# Patient Record
Sex: Male | Born: 1965 | Race: Black or African American | Hispanic: No | Marital: Single | Smoking: Never smoker
Health system: Southern US, Community
[De-identification: ages and names within clinical notes are randomized; demographics above are authoritative.]

## PROBLEM LIST (undated history)

## (undated) DIAGNOSIS — E785 Hyperlipidemia, unspecified: Secondary | ICD-10-CM

## (undated) DIAGNOSIS — I2699 Other pulmonary embolism without acute cor pulmonale: Secondary | ICD-10-CM

## (undated) DIAGNOSIS — I82409 Acute embolism and thrombosis of unspecified deep veins of unspecified lower extremity: Secondary | ICD-10-CM

## (undated) DIAGNOSIS — Z86718 Personal history of other venous thrombosis and embolism: Secondary | ICD-10-CM

## (undated) DIAGNOSIS — F329 Major depressive disorder, single episode, unspecified: Secondary | ICD-10-CM

## (undated) DIAGNOSIS — F32A Depression, unspecified: Secondary | ICD-10-CM

## (undated) DIAGNOSIS — M5030 Other cervical disc degeneration, unspecified cervical region: Secondary | ICD-10-CM

## (undated) DIAGNOSIS — G8929 Other chronic pain: Secondary | ICD-10-CM

## (undated) DIAGNOSIS — E7800 Pure hypercholesterolemia, unspecified: Secondary | ICD-10-CM

## (undated) DIAGNOSIS — R519 Headache, unspecified: Secondary | ICD-10-CM

## (undated) DIAGNOSIS — I824Y9 Acute embolism and thrombosis of unspecified deep veins of unspecified proximal lower extremity: Secondary | ICD-10-CM

## (undated) HISTORY — PX: BACK SURGERY: SHX140

## (undated) HISTORY — DX: Other chronic pain: G89.29

## (undated) HISTORY — DX: Pure hypercholesterolemia, unspecified: E78.00

## (undated) HISTORY — DX: Personal history of other venous thrombosis and embolism: Z86.718

## (undated) HISTORY — PX: CERVICAL DISC SURGERY: SHX588

## (undated) HISTORY — DX: Headache, unspecified: R51.9

## (undated) HISTORY — DX: Depression, unspecified: F32.A

---
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
## 2012-01-21 ENCOUNTER — Emergency Department (INDEPENDENT_AMBULATORY_CARE_PROVIDER_SITE_OTHER)
Admission: EM | Admit: 2012-01-21 | Discharge: 2012-01-21 | Disposition: A | Discharge status: Home / Self Care | Payer: Medicare Other | Source: Home / Self Care | Attending: Emergency Medicine | Admitting: Emergency Medicine

## 2012-01-21 ENCOUNTER — Encounter (HOSPITAL_COMMUNITY): Payer: Self-pay

## 2012-01-21 DIAGNOSIS — J069 Acute upper respiratory infection, unspecified: Secondary | ICD-10-CM

## 2012-01-21 DIAGNOSIS — R05 Cough: Secondary | ICD-10-CM

## 2012-01-21 DIAGNOSIS — R059 Cough, unspecified: Secondary | ICD-10-CM

## 2012-01-21 HISTORY — DX: Other cervical disc degeneration, unspecified cervical region: M50.30

## 2012-01-21 MED ORDER — ACETAMINOPHEN-CODEINE #3 300-30 MG PO TABS: 1.0000 | ORAL_TABLET | Freq: Four times a day (QID) | ORAL | Status: DC | PRN

## 2012-01-21 MED ORDER — PREDNISONE 20 MG PO TABS: 40.0000 mg | ORAL_TABLET | Freq: Every day | ORAL | Status: DC

## 2012-01-21 NOTE — ED Notes (Addendum)
reportedly had flu shot 11-3, has developed cough , congestion x 3 days; NAD; C/o pain left scapular area

## 2012-01-21 NOTE — ED Provider Notes (Signed)
History     CSN: [id number]  Arrival date & time 01/21/12  1350   First MD Initiated Contact with Patient 01/21/12 1356      Chief Complaint  Patient presents with  . Cough    (Consider location/radiation/quality/duration/timing/severity/associated sxs/prior treatment) HPI Comments: Patient presents urgent care describing that for the last 3 days he's had a dry cough as been congested of his nose and a sore throat. He described it he had the flu shot this year November. Denies any shortness of breath, or wheezing or fevers within the last 3 days. Some discomfort when he coughs on his left scapular area. Denies any pain or shortness of breath or fevers. Feels somewhat congested in his chest but his not coughing up any phlegm " it's mainly a dry cough"  Patient is a 47 y.o. male presenting with cough. The history is provided by the patient.  Cough This is a new problem. The current episode started 2 days ago. The problem occurs constantly. The problem has been gradually worsening. The cough is non-productive. There has been no fever. Associated symptoms include rhinorrhea and sore throat. Pertinent negatives include no chest pain, no chills, no weight loss, no ear pain, no myalgias, no shortness of breath and no wheezing. He has tried nothing for the symptoms. The treatment provided no relief. His past medical history does not include asthma.    Past Medical History  Diagnosis Date  . Degenerative cervical disc     Past Surgical History  Procedure Date  . Cervical disc surgery     History reviewed. No pertinent family history.  History  Substance Use Topics  . Smoking status: Never Smoker   . Smokeless tobacco: Not on file  . Alcohol Use: No      Review of Systems  Constitutional: Negative for fever, chills, weight loss, activity change and fatigue.  HENT: Positive for sore throat and rhinorrhea. Negative for ear pain, neck pain and neck stiffness.   Respiratory: Positive  for cough. Negative for chest tightness, shortness of breath and wheezing.   Cardiovascular: Negative for chest pain.  Musculoskeletal: Negative for myalgias.  Skin: Negative for rash.  Neurological: Negative for dizziness.    Allergies  Review of patient's allergies indicates no known allergies.  Home Medications   Current Outpatient Rx  Name  Route  Sig  Dispense  Refill  . ACETAMINOPHEN-CODEINE #3 300-30 MG PO TABS   Oral   Take 1-2 tablets by mouth every 6 (six) hours as needed for pain.   15 tablet   0   . PREDNISONE 20 MG PO TABS   Oral   Take 2 tablets (40 mg total) by mouth daily. 2 tablets daily for 5 days   10 tablet   0     BP 127/76  Pulse 100  Temp 97.6 F (36.4 C) (Oral)  Resp 16  SpO2 100%  Physical Exam  Nursing note and vitals reviewed. Constitutional: He appears well-developed and well-nourished.  Non-toxic appearance. He does not have a sickly appearance. He does not appear ill. No distress.  HENT:  Head: Normocephalic.  Eyes: Conjunctivae normal are normal.  Neck: Neck supple. No JVD present.  Cardiovascular: Normal rate.  Exam reveals no gallop and no friction rub.   Murmur heard.      Apical pulse 88 beats per minute-during cardiovascular exam  Pulmonary/Chest: Effort normal and breath sounds normal. No respiratory distress. He has no decreased breath sounds. He has no wheezes. He has  no rhonchi. He has no rales. He exhibits no tenderness.  Skin: No rash noted. No erythema.    ED Course  Procedures (including critical care time)  Labs Reviewed - No data to display No results found.   1. Upper respiratory infection   2. Cough       MDM  Symptoms and exam consistent with a new lead develop upper respiratory infection. In no respiratory distress afebrile and with a pulse ox on room air 100%. On exam was unremarkable. Prescribe patient Tylenol No. 3, for cough and discomfort and prednisone for bronchial congestion. Patient was  instructed to followup with primary care Dr. Or  [place] was to persist beyond 2 weeks. For a second lung exam.     Jimmie Molly, MD 01/21/12 1440

## 2012-02-20 ENCOUNTER — Encounter (HOSPITAL_COMMUNITY): Payer: Self-pay

## 2012-02-20 ENCOUNTER — Emergency Department (HOSPITAL_COMMUNITY)
Admission: EM | Admit: 2012-02-20 | Discharge: 2012-02-20 | Disposition: A | Discharge status: Home / Self Care | Payer: PRIVATE HEALTH INSURANCE | Attending: Emergency Medicine | Admitting: Emergency Medicine

## 2012-02-20 DIAGNOSIS — M549 Dorsalgia, unspecified: Secondary | ICD-10-CM

## 2012-02-20 DIAGNOSIS — R209 Unspecified disturbances of skin sensation: Secondary | ICD-10-CM | POA: Insufficient documentation

## 2012-02-20 DIAGNOSIS — R238 Other skin changes: Secondary | ICD-10-CM | POA: Insufficient documentation

## 2012-02-20 DIAGNOSIS — Z8739 Personal history of other diseases of the musculoskeletal system and connective tissue: Secondary | ICD-10-CM | POA: Insufficient documentation

## 2012-02-20 DIAGNOSIS — M545 Low back pain, unspecified: Secondary | ICD-10-CM | POA: Insufficient documentation

## 2012-02-20 MED ORDER — IBUPROFEN 600 MG PO TABS: 600.0000 mg | ORAL_TABLET | Freq: Four times a day (QID) | ORAL | Status: DC | PRN

## 2012-02-20 MED ORDER — CYCLOBENZAPRINE HCL 10 MG PO TABS: 10.0000 mg | ORAL_TABLET | Freq: Two times a day (BID) | ORAL | Status: DC | PRN

## 2012-02-20 MED ORDER — HYDROCODONE-IBUPROFEN 7.5-200 MG PO TABS: 1.0000 | ORAL_TABLET | Freq: Four times a day (QID) | ORAL | Status: DC | PRN

## 2012-02-20 NOTE — ED Notes (Addendum)
Some ecchymosis noted to 2nd finger of right hand. Sharp and dull sensation intact bilaterally to lower extremities.

## 2012-02-20 NOTE — ED Notes (Signed)
Pt presents with 1 week h/o low back pain.  Pt denies any injury, reports pain radiates around into his groin and into bilateral thighs.  Pt denies any injury, denies any dysuria or discolored urine.  Pt reports last bowel movement was 3-4 days ago.  Pt also reports "discoloration" to R 2nd fingertip.  Pt denies any injury to finger or swelling, reports a "tingling sensation" to R fingers that is now relieved except to R thumb and 2nd finger.  Pt reports "soreness" across forehead.

## 2012-02-20 NOTE — ED Provider Notes (Signed)
History     CSN: [id number]  Arrival date & time 02/20/12  1126   First MD Initiated Contact with Patient 02/20/12 1358      No chief complaint on file.  ) HPI Pt presents with 1 week h/o low back pain. Pt denies any injury, reports pain radiates around into his groin and into bilateral thighs. Pt denies any injury, denies any dysuria or discolored urine. Pt reports last bowel movement was 3-4 days ago. Pt also reports "discoloration" to R 2nd fingertip. Pt denies any injury to finger or swelling, reports a "tingling sensation" to R fingers that is now relieved except to R thumb and 2nd finger  Past Medical History  Diagnosis Date  . Degenerative cervical disc     Past Surgical History  Procedure Laterality Date  . Cervical disc surgery      History reviewed. No pertinent family history.  History  Substance Use Topics  . Smoking status: Never Smoker   . Smokeless tobacco: Not on file  . Alcohol Use: No      Review of Systems All other systems reviewed and are negati Allergies  Review of patient's allergies indicates no known allergies.  Home Medications   Current Outpatient Rx  Name  Route  Sig  Dispense  Refill  . cyclobenzaprine (FLEXERIL) 10 MG tablet   Oral   Take 1 tablet (10 mg total) by mouth 2 (two) times daily as needed for muscle spasms.   20 tablet   0   . HYDROcodone-ibuprofen (VICOPROFEN) 7.5-200 MG per tablet   Oral   Take 1 tablet by mouth every 6 (six) hours as needed for pain.   30 tablet   0   . ibuprofen (ADVIL,MOTRIN) 600 MG tablet   Oral   Take 1 tablet (600 mg total) by mouth every 6 (six) hours as needed for pain.   30 tablet   0     BP 118/85  Pulse 56  Temp(Src) 98.1 F (36.7 C) (Oral)  Resp 18  SpO2 99%  Physical Exam  Nursing note and vitals reviewed. Constitutional: He is oriented to person, place, and time. He appears well-developed and well-nourished. No distress.  HENT:  Head: Normocephalic and atraumatic.    Eyes: Pupils are equal, round, and reactive to light.  Neck: Normal range of motion.  Cardiovascular: Normal rate and intact distal pulses.   Pulmonary/Chest: No respiratory distress.  Abdominal: Normal appearance. He exhibits no distension.  Musculoskeletal: He exhibits tenderness.       Back:  Pain to palpation and movement were indicated  Neurological: He is alert and oriented to person, place, and time. No cranial nerve deficit.  Skin: Skin is warm and dry. No rash noted.  Psychiatric: He has a normal mood and affect. His behavior is normal.    ED Course  Procedures (including critical care time)  Labs Reviewed - No data to display No results found.   1. Back pain       MDM          Nelia Shi, MD 02/20/12 1524

## 2012-04-22 ENCOUNTER — Encounter (HOSPITAL_COMMUNITY): Payer: Self-pay | Admitting: Emergency Medicine

## 2012-04-22 ENCOUNTER — Emergency Department (HOSPITAL_COMMUNITY)
Admission: EM | Admit: 2012-04-22 | Discharge: 2012-04-22 | Disposition: A | Discharge status: Home / Self Care | Payer: PRIVATE HEALTH INSURANCE | Source: Home / Self Care

## 2012-04-22 NOTE — ED Notes (Signed)
Pt c/o neck pain x 3 days. Pt states started new work out and shortly after started having stiffness and pain with rotating head to the left.   Pt has a hx of c4 disk replacement.   Pt has not tried anything for treatment.

## 2012-04-26 ENCOUNTER — Ambulatory Visit: Payer: Self-pay | Admitting: Medical

## 2012-05-21 ENCOUNTER — Ambulatory Visit: Payer: PRIVATE HEALTH INSURANCE | Attending: Sports Medicine | Admitting: Physical Therapy

## 2012-05-21 DIAGNOSIS — M542 Cervicalgia: Secondary | ICD-10-CM | POA: Insufficient documentation

## 2012-05-21 DIAGNOSIS — IMO0001 Reserved for inherently not codable concepts without codable children: Secondary | ICD-10-CM | POA: Insufficient documentation

## 2012-06-02 ENCOUNTER — Ambulatory Visit: Payer: PRIVATE HEALTH INSURANCE | Admitting: Physical Therapy

## 2012-06-03 ENCOUNTER — Ambulatory Visit: Payer: PRIVATE HEALTH INSURANCE | Admitting: Physical Therapy

## 2012-06-08 ENCOUNTER — Encounter: Payer: Medicare Other | Admitting: Physical Therapy

## 2012-06-10 ENCOUNTER — Encounter: Payer: Medicare Other | Admitting: Physical Therapy

## 2012-06-29 ENCOUNTER — Emergency Department (HOSPITAL_COMMUNITY)
Admission: EM | Admit: 2012-06-29 | Discharge: 2012-06-29 | Disposition: A | Discharge status: Home / Self Care | Payer: PRIVATE HEALTH INSURANCE | Attending: Emergency Medicine | Admitting: Emergency Medicine

## 2012-06-29 ENCOUNTER — Encounter (HOSPITAL_COMMUNITY): Payer: Self-pay | Admitting: *Deleted

## 2012-06-29 ENCOUNTER — Emergency Department (HOSPITAL_COMMUNITY): Payer: PRIVATE HEALTH INSURANCE

## 2012-06-29 DIAGNOSIS — Z8739 Personal history of other diseases of the musculoskeletal system and connective tissue: Secondary | ICD-10-CM | POA: Insufficient documentation

## 2012-06-29 DIAGNOSIS — R072 Precordial pain: Secondary | ICD-10-CM | POA: Insufficient documentation

## 2012-06-29 DIAGNOSIS — R079 Chest pain, unspecified: Secondary | ICD-10-CM

## 2012-06-29 DIAGNOSIS — Z8719 Personal history of other diseases of the digestive system: Secondary | ICD-10-CM | POA: Insufficient documentation

## 2012-06-29 LAB — CBC
MCHC: 34 g/dL (ref 30.0–36.0)
MCV: 76.7 fL — ABNORMAL LOW (ref 78.0–100.0)
RBC: 5.37 MIL/uL (ref 4.22–5.81)
WBC: 3.2 10*3/uL — ABNORMAL LOW (ref 4.0–10.5)

## 2012-06-29 LAB — BASIC METABOLIC PANEL
BUN: 18 mg/dL (ref 6–23)
CO2: 28 mEq/L (ref 19–32)
GFR calc non Af Amer: 73 mL/min — ABNORMAL LOW (ref >90)
Glucose, Bld: 124 mg/dL — ABNORMAL HIGH (ref 70–99)
Potassium: 3.9 mEq/L (ref 3.5–5.1)

## 2012-06-29 MED ORDER — NITROGLYCERIN 2 % TD OINT: 1.0000 [in_us] | TOPICAL_OINTMENT | Freq: Once | TRANSDERMAL | Status: DC

## 2012-06-29 NOTE — ED Provider Notes (Signed)
History    CSN: [id number] Arrival date & time 06/29/12  1843  First MD Initiated Contact with Patient 06/29/12 2043     Chief Complaint  Patient presents with  . Chest Pain   (Consider location/radiation/quality/duration/timing/severity/associated sxs/prior Treatment) Patient is a 47 y.o. male presenting with chest pain.  Chest Pain Associated symptoms: no cough, no fatigue, no fever, no nausea, no shortness of breath and not vomiting    Pt is a 47yo male presenting with midsternal chest pain that woke pt from a nap earlier this afternoon, described as sharp in nature, non-radiating, 7/10, and constant.  Nothing seemed to make pain better or worse.  Pt came to be checked out because he was concerned pain was not going away.  States pain persisted until he went to CXR, since then pain has subsided. Denies fever, SOB, pain with breathing, cough, diaphoresis, n/v/d.  Reports similar episodes in past but none that lasted this long.  Pt states he is relatively healthy.  Denies cardiopulmonary disease, known GERD, no recent travel, surgeries, or sick contacts.  Denies any daily medication use.  Denies alcohol or other drug use, including cocaine.     Past Medical History  Diagnosis Date  . Degenerative cervical disc    Past Surgical History  Procedure Laterality Date  . Cervical disc surgery     No family history on file. History  Substance Use Topics  . Smoking status: Never Smoker   . Smokeless tobacco: Not on file  . Alcohol Use: No    Review of Systems  Constitutional: Negative for fever, chills and fatigue.  Respiratory: Negative for cough and shortness of breath.   Cardiovascular: Positive for chest pain.  Gastrointestinal: Negative for nausea, vomiting and diarrhea.  All other systems reviewed and are negative.    Allergies  Other and Shellfish allergy  Home Medications   Current Outpatient Rx  Name  Route  Sig  Dispense  Refill  . cyclobenzaprine (FLEXERIL) 10  MG tablet   Oral   Take 1 tablet (10 mg total) by mouth 2 (two) times daily as needed for muscle spasms.   20 tablet   0   . HYDROcodone-ibuprofen (VICOPROFEN) 7.5-200 MG per tablet   Oral   Take 1 tablet by mouth every 6 (six) hours as needed for pain.   30 tablet   0   . ibuprofen (ADVIL,MOTRIN) 600 MG tablet   Oral   Take 1 tablet (600 mg total) by mouth every 6 (six) hours as needed for pain.   30 tablet   0    BP 102/84  Pulse 58  Temp(Src) 98.2 F (36.8 C) (Oral)  Resp 18  SpO2 100% Physical Exam  Nursing note and vitals reviewed. Constitutional: He appears well-developed and well-nourished. No distress.  Pt lying comfortably on exam bed. NAD.   HENT:  Head: Normocephalic and atraumatic.  Eyes: Conjunctivae are normal. No scleral icterus.  Neck: Normal range of motion.  Cardiovascular: Normal rate, regular rhythm and normal heart sounds.   Pulmonary/Chest: Effort normal and breath sounds normal. No respiratory distress. He has no wheezes. He has no rales. He exhibits no tenderness.  Abdominal: Soft. Bowel sounds are normal. He exhibits no distension and no mass. There is no tenderness. There is no rebound and no guarding.  Musculoskeletal: Normal range of motion.  Neurological: He is alert.  Skin: Skin is warm and dry. He is not diaphoretic.    ED Course  Procedures (including critical care  time) Labs Reviewed  CBC - Abnormal; Notable for the following:    WBC 3.2 (*)    MCV 76.7 (*)    Platelets 130 (*)    All other components within normal limits  BASIC METABOLIC PANEL - Abnormal; Notable for the following:    Glucose, Bld 124 (*)    GFR calc non Af Amer 73 (*)    GFR calc Af Amer 85 (*)    All other components within normal limits  POCT I-STAT TROPONIN I   Dg Chest 2 View  06/29/2012   *RADIOLOGY REPORT*  Clinical Data: Chest pain.  CHEST - 2 VIEW  Comparison: None.  Findings: The heart size and pulmonary vascularity are normal and the lungs are  clear.  No osseous abnormality.  IMPRESSION: Normal exam.   Original Report Authenticated By: Francene Boyers, M.D.   1. Nonspecific chest pain     MDM  Pt presenting with chest pain is a young healthy male.  Pain had resolved after pt went to CXR w/o any medications given.  Low concern for ACS.  istat troponin: unremarkable CBC: leukopenia, no previous labs to compare BMP: mildly decreased GFR but BUN and Cr WNL.  No previous labs.   CXR: nl exam  EKG: nl  F/u with PCP, [facility] info provided. Return precautions given. Pt verbalized understanding and agreement with tx plan. Vitals: unremarkable. Discharged in stable condition.    Discussed pt with attending during ED encounter.    Junius Finner, PA-C 06/29/12 2212

## 2012-06-29 NOTE — ED Notes (Signed)
Pt is here with chest pain that woke him up while laying down, pt states it continued to hurt while on Facebook.  No sob, nausea or sweating.  No pain now.

## 2012-06-29 NOTE — ED Provider Notes (Signed)
9:51 PM  Date: 06/29/2012  Rate:69  Rhythm: normal sinus rhythm  QRS Axis: normal  Intervals: normal  ST/T Wave abnormalities: normal  Conduction Disutrbances:none  Narrative Interpretation: Normal EKG  Old EKG Reviewed: none available    Carleene Cooper III, MD 06/29/12 2152

## 2012-06-29 NOTE — Discharge Instructions (Signed)
Chest Pain, Nonspecific  It is often hard to give a specific diagnosis for the cause of chest pain. There is always a chance that your pain could be related to something serious, like a heart attack or a blood clot in the lungs. You need to follow up with your caregiver for further evaluation. More lab tests or other studies such as X-rays, electrocardiography, stress testing, or cardiac imaging may be needed to find the cause of your pain.  Most of the time, nonspecific chest pain improves within 2 to 3 days with rest and mild pain medicine. For the next few days, avoid physical exertion or activities that bring on pain. Do not smoke. Avoid drinking alcohol. Call your caregiver for routine follow-up as advised.   SEEK IMMEDIATE MEDICAL CARE IF:  · You develop increased chest pain or pain that radiates to the arm, neck, jaw, back, or abdomen.  · You develop shortness of breath, increased coughing, or you start coughing up blood.  · You have severe back or abdominal pain, nausea, or vomiting.  · You develop severe weakness, fainting, fever, or chills.  Document Released: 12/23/2004 Document Revised: 03/17/2011 Document Reviewed: 06/12/2006  [employer]® Patient Information ©2014 [employer].

## 2012-06-30 NOTE — ED Provider Notes (Signed)
Medical screening examination/treatment/procedure(s) were conducted as a shared visit with non-physician practitioner(s) and myself.  I personally evaluated the patient during the encounter 47 yo man with substernal pain after waking from a nap.  PE negative. EKG, chest x-ray, TNI all negative.  Doubt ACS.  Reassured and released, referred to [facility].  Carleene Cooper III, MD 06/30/12 [phone]

## 2012-09-17 ENCOUNTER — Emergency Department (INDEPENDENT_AMBULATORY_CARE_PROVIDER_SITE_OTHER)
Admission: EM | Admit: 2012-09-17 | Discharge: 2012-09-17 | Disposition: A | Discharge status: Home / Self Care | Payer: PRIVATE HEALTH INSURANCE | Source: Home / Self Care | Attending: Family Medicine | Admitting: Family Medicine

## 2012-09-17 ENCOUNTER — Encounter (HOSPITAL_COMMUNITY): Payer: Self-pay | Admitting: Emergency Medicine

## 2012-09-17 ENCOUNTER — Emergency Department (INDEPENDENT_AMBULATORY_CARE_PROVIDER_SITE_OTHER): Payer: PRIVATE HEALTH INSURANCE

## 2012-09-17 DIAGNOSIS — R062 Wheezing: Secondary | ICD-10-CM

## 2012-09-17 MED ORDER — ALBUTEROL SULFATE (5 MG/ML) 0.5% IN NEBU
5.0000 mg | INHALATION_SOLUTION | Freq: Once | RESPIRATORY_TRACT | Status: AC
  Administered 2012-09-17: 5 mg via RESPIRATORY_TRACT

## 2012-09-17 MED ORDER — CETIRIZINE HCL 10 MG PO TABS
10.0000 mg | ORAL_TABLET | Freq: Every day | ORAL | Status: DC
Start: 2012-09-17 — End: 2012-10-21

## 2012-09-17 MED ORDER — IPRATROPIUM BROMIDE 0.02 % IN SOLN
0.5000 mg | Freq: Once | RESPIRATORY_TRACT | Status: AC
  Administered 2012-09-17: 0.5 mg via RESPIRATORY_TRACT

## 2012-09-17 MED ORDER — ALBUTEROL SULFATE (5 MG/ML) 0.5% IN NEBU
INHALATION_SOLUTION | RESPIRATORY_TRACT | Status: AC
  Filled 2012-09-17: qty 1

## 2012-09-17 MED ORDER — ALBUTEROL SULFATE HFA 108 (90 BASE) MCG/ACT IN AERS: 2.0000 | INHALATION_SPRAY | Freq: Four times a day (QID) | RESPIRATORY_TRACT | Status: DC | PRN

## 2012-09-17 MED ORDER — METHYLPREDNISOLONE SODIUM SUCC 125 MG IJ SOLR
INTRAMUSCULAR | Status: AC
  Filled 2012-09-17: qty 2

## 2012-09-17 MED ORDER — METHYLPREDNISOLONE SODIUM SUCC 125 MG IJ SOLR
125.0000 mg | Freq: Once | INTRAMUSCULAR | Status: AC
  Administered 2012-09-17: 125 mg via INTRAMUSCULAR

## 2012-09-17 NOTE — ED Provider Notes (Signed)
CSN: [id number]     Arrival date & time 09/17/12  1248 History   First MD Initiated Contact with Patient 09/17/12 1334     Chief Complaint  Patient presents with  . URI   (Consider location/radiation/quality/duration/timing/severity/associated sxs/prior Treatment) Patient is a 47 y.o. male presenting with URI. The history is provided by the patient.  URI Presenting symptoms: congestion, cough and fatigue   Presenting symptoms: no ear pain, no facial pain, no fever, no rhinorrhea and no sore throat   Severity:  Moderate Onset quality:  Unable to specify Progression since onset: Patient states coughing is worse at night and is unable to lay down at times due to coughing. Associated symptoms: headaches   Associated symptoms: no neck pain, no sinus pain, no sneezing and no wheezing     Past Medical History  Diagnosis Date  . Degenerative cervical disc    Past Surgical History  Procedure Laterality Date  . Cervical disc surgery     History reviewed. No pertinent family history. History  Substance Use Topics  . Smoking status: Never Smoker   . Smokeless tobacco: Not on file  . Alcohol Use: No    Review of Systems  Constitutional: Positive for fatigue. Negative for fever.  HENT: Positive for congestion. Negative for ear pain, nosebleeds, sore throat, facial swelling, rhinorrhea, sneezing, neck pain, neck stiffness, dental problem and sinus pressure.   Eyes: Negative.   Respiratory: Positive for cough and shortness of breath. Negative for apnea, choking, chest tightness, wheezing and stridor.   Cardiovascular: Negative.   Gastrointestinal: Negative.   Endocrine: Negative.   Genitourinary: Negative.   Musculoskeletal:       Reports history of R arm pain that woke him from a sleep recently.  States resolved and it is not hurting him today.   Skin: Negative.   Allergic/Immunologic: Negative.   Neurological: Positive for headaches. Negative for dizziness, weakness,  light-headedness and numbness.  Hematological: Negative.   Psychiatric/Behavioral: Negative.    Denies history of GERD or reflux or heartburn.  No history of indigestion.  Allergies  Other and Shellfish allergy  Home Medications   Current Outpatient Rx  Name  Route  Sig  Dispense  Refill  . albuterol (PROVENTIL HFA;VENTOLIN HFA) 108 (90 BASE) MCG/ACT inhaler   Inhalation   Inhale 2 puffs into the lungs every 6 (six) hours as needed (For wheezing or cough.).   1 Inhaler   2   . cetirizine (ZYRTEC ALLERGY) 10 MG tablet   Oral   Take 1 tablet (10 mg total) by mouth daily.   30 tablet   3   . cyclobenzaprine (FLEXERIL) 10 MG tablet   Oral   Take 1 tablet (10 mg total) by mouth 2 (two) times daily as needed for muscle spasms.   20 tablet   0   . HYDROcodone-ibuprofen (VICOPROFEN) 7.5-200 MG per tablet   Oral   Take 1 tablet by mouth every 6 (six) hours as needed for pain.   30 tablet   0   . ibuprofen (ADVIL,MOTRIN) 600 MG tablet   Oral   Take 1 tablet (600 mg total) by mouth every 6 (six) hours as needed for pain.   30 tablet   0    BP 113/76  Pulse 74  Temp(Src) 98.2 F (36.8 C) (Oral)  Resp 18  SpO2 98% Physical Exam  Nursing note and vitals reviewed. Constitutional: He is oriented to person, place, and time. He appears well-developed and well-nourished. No distress.  HENT:  Head: Normocephalic and atraumatic.  Right Ear: External ear normal.  Left Ear: External ear normal.  Nasal turbinates mildly enlarged and boggy.  Nares patent bilaterally.  R tympanic membrane unremarkable.  L tympanic membrane with serous fluid noted. No erythema.  Eyes: Pupils are equal, round, and reactive to light. Right eye exhibits no discharge. Left eye exhibits no discharge. No scleral icterus.  Neck: Normal range of motion. Neck supple. No JVD present. No tracheal deviation present. No thyromegaly present.  Cardiovascular: Normal rate, regular rhythm, normal heart sounds and  intact distal pulses.  Exam reveals no gallop and no friction rub.   No murmur heard. Pulmonary/Chest: Effort normal and breath sounds normal. Stridor present. No respiratory distress. He has no wheezes. He has no rales. He exhibits no tenderness.  Expiratory breath sounds diminished in bases.  No egophany or adventitious breath sounds noted.   Lymphadenopathy:    He has no cervical adenopathy.  Neurological: He is alert and oriented to person, place, and time.  Skin: Skin is warm and dry. No rash noted. He is not diaphoretic. No erythema. No pallor.  Psychiatric: He has a normal mood and affect. His behavior is normal.    ED Course  Procedures (including critical care time) Labs Review Labs Reviewed - No data to display Imaging Review Dg Chest 2 View  09/17/2012   *RADIOLOGY REPORT*  Clinical Data: Cough  CHEST - 2 VIEW  Comparison:  June 29, 2012  Findings:  Lungs clear.  Heart size and pulmonary vascularity are normal.  No adenopathy.  No bone lesions.  IMPRESSION: No abnormality noted.   Original Report Authenticated By: Bretta Bang, M.D.   Patient's lung sounds reevaluated during breathing treatment.  Expiratory wheezing noted in RML and bases.   Lungs clear all fields following breathing treatment.  Patient is smiling and verbalizes he feels "much better".    MDM   1. Wheezing    Meds ordered this encounter  Medications  . ipratropium (ATROVENT) nebulizer solution 0.5 mg    Sig:   . albuterol (PROVENTIL) (5 MG/ML) 0.5% nebulizer solution 5 mg    Sig:   . methylPREDNISolone sodium succinate (SOLU-MEDROL) 125 mg/2 mL injection 125 mg    Sig:   . cetirizine (ZYRTEC ALLERGY) 10 MG tablet    Sig: Take 1 tablet (10 mg total) by mouth daily.    Dispense:  30 tablet    Refill:  3    Order Specific Question:  Supervising Provider    Answer:  Linna Hoff [phone]  . albuterol (PROVENTIL HFA;VENTOLIN HFA) 108 (90 BASE) MCG/ACT inhaler    Sig: Inhale 2 puffs into the lungs  every 6 (six) hours as needed (For wheezing or cough.).    Dispense:  1 Inhaler    Refill:  2    Order Specific Question:  Supervising Provider    Answer:  Linna Hoff [phone]   [facility] of chronic allergic symptoms and cough related to wheezing.  Patient to take cetrizine 10mg  daily at bedtime for allergies.    Weber Cooks, NP 09/17/12 1444

## 2012-09-17 NOTE — ED Notes (Signed)
Patient has multiple complaints.  Patient c/o stuffiness in nose and sinus' especially at night.  Patient also c/o aggravating cough.

## 2012-09-18 NOTE — ED Provider Notes (Signed)
Medical screening examination/treatment/procedure(s) were performed by non-physician practitioner and as supervising physician I was immediately available for consultation/collaboration.   [facility]; MD  Sharin Grave, MD 09/18/12 [phone]

## 2012-10-21 ENCOUNTER — Encounter (HOSPITAL_COMMUNITY): Payer: Self-pay | Admitting: Psychiatry

## 2012-10-21 ENCOUNTER — Encounter (INDEPENDENT_AMBULATORY_CARE_PROVIDER_SITE_OTHER): Payer: Self-pay

## 2012-10-21 ENCOUNTER — Ambulatory Visit (INDEPENDENT_AMBULATORY_CARE_PROVIDER_SITE_OTHER): Payer: 59 | Admitting: Psychiatry

## 2012-10-21 VITALS — BP 115/70 | HR 62 | Ht 71.0 in | Wt 171.8 lb

## 2012-10-21 DIAGNOSIS — F200 Paranoid schizophrenia: Secondary | ICD-10-CM

## 2012-10-21 MED ORDER — RISPERIDONE 2 MG PO TABS: 2.0000 mg | ORAL_TABLET | Freq: Every day | ORAL | Status: DC

## 2012-10-21 NOTE — Progress Notes (Signed)
[facility] Initial Assessment Note  Adrian Cantu [id number] 47 y.o.  10/21/2012 10:45 AM  Chief Complaint:  I need my Risperdal.  History of Present Illness:  Patient is a 47 year old African American single, unemployed currently homeless self referred for the treatment of his psychiatric illness.  Patient is a very poor historian.  He brought empty bottle of Risperdal 2 mg with him to refill.  Patient told he has recently moved from [place] 9 months ago and he is out of his risperidone.  Patient told the past 2 weeks he has been experiencing a hallucination, paranoia and hearing voices.  He feels dishes are breaking,  Birds are chanting, people are talking about him and he is unable to sleep.  Patient denies any suicidal thoughts or homicidal thoughts however admitted to attention poor concentration and feeling paranoid.  Patient don't know why he moved to [place] but over.  He remembers someone dropped him at [employer] from [place].  When I asked why he chose [place], patient applied because I am running from shadow.  Patient does he tried [place] first however they refuse to see him because of the insurance.  He was recommended to call this place for medication refill.  Patient insists that he needed Risperdal otherwise he'll not sleep.  Patient was seen ACT team in [place].  We have the records from Dr. Mariana Kaufman, patient was moved from [place] to live with his mother.  He has been getting Risperdal for past few years with good response.  Patient is able to do his ADLs, he has no weapons, he admitted history of paranoia and believes people in [place] are controlling his mind and thinking.  When I ask him why [place], patient replied "I don't know".  The patient never been to [place].  He has no family in [place].  He does not know anybody in [place].  Patient appears loose and disorganized however he denies any suicidal thoughts or homicidal thoughts.   Patient reported that he feels much better when he takes the Risperdal.  He has not seen his psychiatrist for more than a year and using his refill until 2 weeks ago he ran out.  Patient is unable to provide any more history.   Suicidal Ideation: No Plan Formed: No Patient has means to carry out plan: No  Homicidal Ideation: No Plan Formed: No Patient has means to carry out plan: No  Past Psychiatric History/Hospitalization(s) As per chart patient has history of psychiatric inpatient treatment in [place] and [place].  She's been taking Risperdal for past few years.  Patient denies any history of suicidal attempt but admitted history of racing thoughts and paranoia and hallucination.  Patient also endorsed history of noncompliance with medication when he do very well. Anxiety: No Bipolar Disorder: No Depression: Yes Mania: No Psychosis: Yes Schizophrenia: Yes Personality Disorder: No Hospitalization for psychiatric illness: Yes History of Electroconvulsive Shock Therapy: No Prior Suicide Attempts: No  Medical History; Patient has history of acid reflux and chronic pain.  He has cervical disc problem.  He is seen physician at [facility].  Traumatic brain injury: Denies  Family History; Denies  Education and Work History; GED  Psychosocial History; Patient was born in [place].  He has no children and never married.  His mother brother and sister lives in [place].  Patient was living in [place], [place], [place] and recently moved to [place].  He has a nephew who lives in [place].  Initially he was living  at [facility] in [place] and now his nephew let him come to stay with him.  Patient's father died when he was 76 year old.  Patient has some contact with his brother and sister but he is more in contact with his mother.  Legal History; Patient endorsed history of legal issues when he trespassed many years ago.  As per chart he  was also arrested for assault and battery in 1995 but the case was dismissed.  Currently he is not on any probation.  History Of Abuse; Patient denies any history of physical, sexual, verbal or emotional abuse.  Substance Abuse History; Patient denies drinking or use of any illegal substances   Review of Systems: Psychiatric: Agitation: No Hallucination: Yes Depressed Mood: Yes Insomnia: Yes Hypersomnia: No Altered Concentration: Yes Feels Worthless: No Grandiose Ideas: No Belief In Special Powers: Yes New/Increased Substance Abuse: No Compulsions: No  Neurologic: Headache: No Seizure: No Paresthesias: No    Outpatient Encounter Prescriptions as of 10/21/2012  Medication Sig Dispense Refill  . risperiDONE (RISPERDAL) 2 MG tablet Take 1 tablet (2 mg total) by mouth at bedtime.  30 tablet  0  . [DISCONTINUED] albuterol (PROVENTIL HFA;VENTOLIN HFA) 108 (90 BASE) MCG/ACT inhaler Inhale 2 puffs into the lungs every 6 (six) hours as needed (For wheezing or cough.).  1 Inhaler  2  . [DISCONTINUED] cetirizine (ZYRTEC ALLERGY) 10 MG tablet Take 1 tablet (10 mg total) by mouth daily.  30 tablet  3  . [DISCONTINUED] cyclobenzaprine (FLEXERIL) 10 MG tablet Take 1 tablet (10 mg total) by mouth 2 (two) times daily as needed for muscle spasms.  20 tablet  0  . [DISCONTINUED] HYDROcodone-ibuprofen (VICOPROFEN) 7.5-200 MG per tablet Take 1 tablet by mouth every 6 (six) hours as needed for pain.  30 tablet  0  . [DISCONTINUED] ibuprofen (ADVIL,MOTRIN) 600 MG tablet Take 1 tablet (600 mg total) by mouth every 6 (six) hours as needed for pain.  30 tablet  0   No facility-administered encounter medications on file as of 10/21/2012.    No results found for this or any previous visit (from the past 2160 hour(s)).    Physical Exam: Constitutional:  BP 115/70  Pulse 62  Ht 5\' 11"  (1.803 m)  Wt 171 lb 12.8 oz (77.928 kg)  BMI 23.97 kg/m2  Musculoskeletal: Strength & Muscle Tone: within  normal limits Gait & Station: normal Patient leans: N/A  Mental Status Examination;  Patient is a middle-aged man who is well nourished and appears to be in his stated age.  He had poor eye contact.  He is guarded and withdrawn.  His speech is slow and sometimes incoherent.  His thought processes circumstantial.  Patient endorsed auditory and visual hallucination however there were no command hallucination.  Patient endorsed paranoia and delusional believes sometimes people talking about him.  They describe his mood is depressed and his affect is constricted.  He appears preoccupied with his own thinking.  He is alert and oriented x3.  His attention and concentration this book.  There were no tremors or shakes.  His psychomotor activity is decreased.  His insight judgment and impulse control is fair.   Medical Decision Making (Choose Three): New problem, with additional work up planned, Review of Psycho-Social Stressors (1), Decision to obtain old records (1), Review and summation of old records (2), Established Problem, Worsening (2), Review of Medication Regimen & Side Effects (2) and Review of New Medication or Change in Dosage (2)  Assessment: Axis I: Chronic paranoid  schizophrenia  Axis II: Deferred  Axis III:  Past Medical History  Diagnosis Date  . Degenerative cervical disc     Axis IV: Moderate   Plan:  I reviewed his history, symptoms, current medication and records from his previous psychiatrist.  Patient is a poor historian who has limited support.  In the past he was with ACT team in [place].  I will restart Risperdal 2 mg at bedtime which helps him in the past.  Patient don't remember any side effects with the Risperdal.  We'll also get records from his primary care physician at [facility] including recent blood work.  I suggested to seek elsewhere for coping and social skills.  This was in detail the risks and benefits of medication and risk of relapse with  noncompliance with medication.  I will see him again in one week's to see if the symptoms are improving.Time spent 55 minutes.  More than 50% of the time spent in psychoeducation, counseling and coordination of care.  Discuss safety plan that anytime having active suicidal thoughts or homicidal thoughts then patient need to call 911 or go to the local emergency room.    ARFEEN,SYED T., MD 10/21/2012

## 2012-10-28 ENCOUNTER — Ambulatory Visit (INDEPENDENT_AMBULATORY_CARE_PROVIDER_SITE_OTHER): Payer: 59 | Admitting: Psychiatry

## 2012-10-28 ENCOUNTER — Encounter (HOSPITAL_COMMUNITY): Payer: Self-pay | Admitting: Psychiatry

## 2012-10-28 VITALS — BP 122/67 | HR 67 | Ht 71.0 in | Wt 173.0 lb

## 2012-10-28 DIAGNOSIS — F200 Paranoid schizophrenia: Secondary | ICD-10-CM

## 2012-10-28 MED ORDER — RISPERIDONE 2 MG PO TABS: 2.0000 mg | ORAL_TABLET | Freq: Every day | ORAL | Status: DC

## 2012-10-28 NOTE — Progress Notes (Signed)
[facility] [id number] Progress Note  Adrian Cantu [id number] 47 y.o.  10/28/2012 4:12 PM  Chief Complaint:  I am feeling better with the Risperdal.  History of Present Illness:  Patient is a 47 year old African American single, unemployed man who was seen first time on October 16 as a self referred.  Patient was without his respiratory and complaining of paranoia, hallucination and poor sleep.  He was complaining of hearing voices , believe dishes are breaking,  Birds are chanting and people are talking about him.  Since taking Risperdal he is much better.  He is sleeping at least 6-7 hours.  Patient remains at times disorganized and seen talking to himself.  Patient is a poor historian.  He stayed at his nephew's place but reported that his nephew moved out.  Patient is moved from [place] and he is still do not remember why he moved from [place].  Patient believes that his paranoia and hallucinations are less intense and less frequent since he started taking Risperdal.  However he continued to have some time irritability.  He remains isolated withdrawn and does not interact with people around him.  The patient is able to do his ADLs and he has no weapons.  He appears loose but denies any active suicidal thoughts or homicidal thoughts.  Patient is reluctant to increase his Risperdal.  Patient is agreeing to see a therapist in this office for counseling.  We are able to get the records from [facility].  He was seen there on October 14.  He was given Nitrostat sublingual as needed for chest pain.  However patient is not taking it.  He was also given azithromycin and nasal spray which patient finished.  He has the blood work which was done on October 14 which shows WBC count 2.7, RBC 5.51, hemoglobin 14.2 Amplitude 126.  His comprehensive metabolic panel shows sodium 142, glucose 82 and his BUN creatinine and liver function tests were normal.  His TSH is 1.41.  His total  cholesterol was 300, triglyceride 132, AST of 77 and LDL 197.  He was also done EKG which shows QTc interval 402 with corrected 81.  Suicidal Ideation: No Plan Formed: No Patient has means to carry out plan: No  Homicidal Ideation: No Plan Formed: No Patient has means to carry out plan: No  Past Psychiatric History/Hospitalization(s) As per chart patient has history of psychiatric inpatient treatment in [place] and [place].  He is taking Risperdal for past few years.  Patient denies any history of suicidal attempt but admitted history of racing thoughts and paranoia and hallucination.  Patient also endorsed history of noncompliance with medication when he do very well. Anxiety: No Bipolar Disorder: No Depression: Yes Mania: No Psychosis: Yes Schizophrenia: Yes Personality Disorder: No Hospitalization for psychiatric illness: Yes History of Electroconvulsive Shock Therapy: No Prior Suicide Attempts: No  Medical History; Patient has history of acid reflux and chronic pain.  He has cervical disc problem.  He is seen physician at [facility]. He was seen there on October 19, 2012. He was given Nitrostat sublingual as needed for chest pain.  However patient is not taking it.  He was also given azithromycin and nasal spray which patient finished.  He has blood work which was done on October 14 which shows WBC count 2.7, RBC 5.51, hemoglobin 14.2 Amplitude 126.  His comprehensive metabolic panel shows sodium 142, glucose 82 and his BUN creatinine and liver function tests were normal.  His  TSH is 1.41.  His total cholesterol was 300, triglyceride 132, AST of 77 and LDL 197.  He was also done EKG which shows QTc interval 402 with corrected 81.   Traumatic brain injury: Denies  Family History; Denies  Education and Work History; GED  Psychosocial History; Patient was born in [place].  He has no children and never married.  His mother brother and sister lives in  [place].  Patient was living in [place], [place], [place] and recently moved to [place].  He has a nephew who lives in [place].  Initially he was living at [facility] in [place] and now his nephew let him come to stay with him.  Patient's father died when he was 26 year old.  Patient has some contact with his brother and sister but he is more in contact with his mother.  Legal History; Patient endorsed history of legal issues when he trespassed many years ago.  As per chart he was also arrested for assault and battery in 1995 but the case was dismissed.  Currently he is not on any probation.  History Of Abuse; Patient denies any history of physical, sexual, verbal or emotional abuse.  Substance Abuse History; Patient denies drinking or use of any illegal substances   Review of Systems: Psychiatric: Agitation: No Hallucination: Yes Depressed Mood: Yes Insomnia: No Hypersomnia: No Altered Concentration: Yes Feels Worthless: No Grandiose Ideas: No Belief In Special Powers: Yes New/Increased Substance Abuse: No Compulsions: No  Neurologic: Headache: No Seizure: No Paresthesias: No    Outpatient Encounter Prescriptions as of 10/28/2012  Medication Sig Dispense Refill  . risperiDONE (RISPERDAL) 2 MG tablet Take 1 tablet (2 mg total) by mouth at bedtime.  30 tablet  0  . [DISCONTINUED] risperiDONE (RISPERDAL) 2 MG tablet Take 1 tablet (2 mg total) by mouth at bedtime.  30 tablet  0   No facility-administered encounter medications on file as of 10/28/2012.    No results found for this or any previous visit (from the past 2160 hour(s)).    Physical Exam: Constitutional:  BP 122/67  Pulse 67  Ht 5\' 11"  (1.803 m)  Wt 173 lb (78.472 kg)  BMI 24.14 kg/m2  Musculoskeletal: Strength & Muscle Tone: within normal limits Gait & Station: normal Patient leans: N/A  Mental Status Examination;  Patient is a middle-aged man who is well nourished and  appears to be in his stated age.  He had poor eye contact.  He is guarded and withdrawn.  His speech is slow and sometimes incoherent.  His thought processes circumstantial.  Patient endorsed auditory and visual hallucination however there were no command hallucination.  Patient endorsed paranoia but denies any delusions.  He described his mood as neutral and his affect is constricted.  He appears preoccupied with his own thinking.  He is alert and oriented x3.  His attention and concentration this book.  There were no tremors or shakes.  His psychomotor activity is decreased.  His insight judgment and impulse control is fair.   Medical Decision Making (Choose Three): Established Problem, Stable/Improving (1), Review of Psycho-Social Stressors (1), Decision to obtain old records (1), Review and summation of old records (2), Review of Last Therapy Session (1) and Review of New Medication or Change in Dosage (2)  Assessment: Axis I: Chronic paranoid schizophrenia  Axis II: Deferred  Axis III:  Past Medical History  Diagnosis Date  . Degenerative cervical disc     Axis IV: Moderate   Plan:  I reviewed his  blood results from [facility].  I recommend to try Risperdal 3 mg but patient refused and like to continue her present dose.  His symptoms are improved from the past.  Patient is not interested to get on ACT and like to continue his treatment in this office.  I recommend to see therapist for coping and social skills.  I will see him again in 6 weeks. Time spent 25 minutes.  More than 50% of the time spent in psychoeducation, counseling and coordination of care.  Discuss safety plan that anytime having active suicidal thoughts or homicidal thoughts then patient need to call 911 or go to the local emergency room.    Dartanion Teo T., MD 10/28/2012

## 2012-11-15 ENCOUNTER — Ambulatory Visit (HOSPITAL_COMMUNITY): Payer: Self-pay | Admitting: Psychiatry

## 2012-12-09 ENCOUNTER — Ambulatory Visit (HOSPITAL_COMMUNITY): Payer: Self-pay | Admitting: Psychiatry

## 2013-08-04 ENCOUNTER — Emergency Department (INDEPENDENT_AMBULATORY_CARE_PROVIDER_SITE_OTHER)
Admission: EM | Admit: 2013-08-04 | Discharge: 2013-08-04 | Disposition: A | Discharge status: Home / Self Care | Payer: PRIVATE HEALTH INSURANCE | Source: Home / Self Care

## 2013-08-04 ENCOUNTER — Encounter (HOSPITAL_COMMUNITY): Payer: Self-pay | Admitting: Emergency Medicine

## 2013-08-04 DIAGNOSIS — J302 Other seasonal allergic rhinitis: Secondary | ICD-10-CM

## 2013-08-04 DIAGNOSIS — R252 Cramp and spasm: Secondary | ICD-10-CM

## 2013-08-04 DIAGNOSIS — J3089 Other allergic rhinitis: Secondary | ICD-10-CM

## 2013-08-04 NOTE — ED Provider Notes (Signed)
CSN: [id number]     Arrival date & time 08/04/13  0813 History   First MD Initiated Contact with Patient 08/04/13 [phone]     Chief Complaint  Patient presents with  . Otalgia   (Consider location/radiation/quality/duration/timing/severity/associated sxs/prior Treatment) HPI Comments: 48 year old male was laying in the bed this morning on his left side. When he flexed his knee he experienced sudden acute severe pain in his right calf. He states the pain lasted approximately 1 minute. He then began to walk until the pain resolves. He walked to the urgent care. Currently he has no pain or other associated symptoms.  His second complaint is that of nasal congestion, change in voice and upper chest congestion.   Past Medical History  Diagnosis Date  . Degenerative cervical disc    Past Surgical History  Procedure Laterality Date  . Cervical disc surgery     History reviewed. No pertinent family history. History  Substance Use Topics  . Smoking status: Never Smoker   . Smokeless tobacco: Not on file  . Alcohol Use: No    Review of Systems  Constitutional: Positive for fever and activity change. Negative for diaphoresis and fatigue.  HENT: Positive for ear pain, sore throat and voice change. Negative for facial swelling, postnasal drip, rhinorrhea and trouble swallowing.        Left ear pain  Eyes: Negative for pain, discharge and redness.  Respiratory: Positive for cough. Negative for chest tightness, shortness of breath and wheezing.   Cardiovascular: Negative.   Gastrointestinal: Negative.   Musculoskeletal: Negative for neck pain and neck stiffness.       As per history of present illness  Neurological: Negative.     Allergies  Other and Shellfish allergy  Home Medications   Prior to Admission medications   Medication Sig Start Date End Date Taking? Authorizing Provider  nitroGLYCERIN (NITROSTAT) 0.4 MG SL tablet Place 0.4 mg under the tongue every 5 (five) minutes as  needed for chest pain.    Historical Provider, MD  risperiDONE (RISPERDAL) 2 MG tablet Take 1 tablet (2 mg total) by mouth at bedtime. 10/28/12 10/28/13  Cleotis Nipper, MD   BP 126/83  Pulse 97  Temp(Src) 98.7 F (37.1 C) (Oral)  Resp 12  SpO2 97% Physical Exam  Nursing note and vitals reviewed. Constitutional: He is oriented to person, place, and time. He appears well-developed and well-nourished. No distress.  HENT:  Right Ear: External ear normal.  Left Ear: External ear normal.  Mouth/Throat: No oropharyngeal exudate.  Oropharynx with clear PND and cobblestoning. Bilateral TMs are dull otherwise normal.  Eyes: Conjunctivae and EOM are normal. Pupils are equal, round, and reactive to light.  Neck: Normal range of motion. Neck supple.  Cardiovascular: Normal rate, regular rhythm and normal heart sounds.   Pulmonary/Chest: Effort normal and breath sounds normal. No respiratory distress. He has no wheezes. He has no rales.  Abdominal: Soft. There is no tenderness.  Musculoskeletal: Normal range of motion. He exhibits no edema and no tenderness.  Right lower extremity examination reveals no tenderness. There is no swelling, deformity or discoloration. The muscle soft tissue is soft without tension or evidence of spasm. No bony tenderness. Full range of motion of the hip and knee. Dorsiflexion and plantar flexion does not exhibit pain. Anterior tibial and pedal pulses are 2+. Extremity examination is unremarkable.  Lymphadenopathy:    He has no cervical adenopathy.  Neurological: He is alert and oriented to person, place, and time. He  exhibits normal muscle tone.  Skin: Skin is warm and dry. No rash noted.  Psychiatric: He has a normal mood and affect.    ED Course  Procedures (including critical care time) Labs Review Labs Reviewed - No data to display  Imaging Review No results found.   MDM   1. Muscle cramp, nocturnal   2. Other seasonal allergic rhinitis     Nl lwer  extremity exam, no signs or sx's of venous occlusion. Asymptomatic with normal function. Claritin or  Allegra Saline nasal spray flonase ns Heat and stretches for legs.     Hayden Rasmussenavid Jordy Verba, NP 08/04/13 [phone]

## 2013-08-04 NOTE — ED Notes (Signed)
Pt  Reports  Symptoms  Of   l  Earache  /  sorethroat     X  3  Weeks  As  Well  As  Pain r  Leg  Which  He  Noticed  Today  -  He  denys  Any injury      He  Ambulated  To  Room  With a  Steady  Fluid  gait

## 2013-08-04 NOTE — Discharge Instructions (Signed)
Allergic Rhinitis Claritin or Allegra for drainage Saline nasal spray to clear congestion Flonase nasal spray Allergic rhinitis is when the mucous membranes in the nose respond to allergens. Allergens are particles in the air that cause your body to have an allergic reaction. This causes you to release allergic antibodies. Through a chain of events, these eventually cause you to release histamine into the blood stream. Although meant to protect the body, it is this release of histamine that causes your discomfort, such as frequent sneezing, congestion, and an itchy, runny nose.  CAUSES  Seasonal allergic rhinitis (hay fever) is caused by pollen allergens that may come from grasses, trees, and weeds. Year-round allergic rhinitis (perennial allergic rhinitis) is caused by allergens such as house dust mites, pet dander, and mold spores.  SYMPTOMS   Nasal stuffiness (congestion).  Itchy, runny nose with sneezing and tearing of the eyes. DIAGNOSIS  Your health care provider can help you determine the allergen or allergens that trigger your symptoms. If you and your health care provider are unable to determine the allergen, skin or blood testing may be used. TREATMENT  Allergic rhinitis does not have a cure, but it can be controlled by:  Medicines and allergy shots (immunotherapy).  Avoiding the allergen. Hay fever may often be treated with antihistamines in pill or nasal spray forms. Antihistamines block the effects of histamine. There are over-the-counter medicines that may help with nasal congestion and swelling around the eyes. Check with your health care provider before taking or giving this medicine.  If avoiding the allergen or the medicine prescribed do not work, there are many new medicines your health care provider can prescribe. Stronger medicine may be used if initial measures are ineffective. Desensitizing injections can be used if medicine and avoidance does not work. Desensitization is  when a patient is given ongoing shots until the body becomes less sensitive to the allergen. Make sure you follow up with your health care provider if problems continue. HOME CARE INSTRUCTIONS It is not possible to completely avoid allergens, but you can reduce your symptoms by taking steps to limit your exposure to them. It helps to know exactly what you are allergic to so that you can avoid your specific triggers. SEEK MEDICAL CARE IF:   You have a fever.  You develop a cough that does not stop easily (persistent).  You have shortness of breath.  You start wheezing.  Symptoms interfere with normal daily activities. Document Released: 09/17/2000 Document Revised: 12/28/2012 Document Reviewed: 08/30/2012 [facility] Patient Information 2015 [place], [place]. This information is not intended to replace advice given to you by your health care provider. Make sure you discuss any questions you have with your health care provider.  Muscle Cramps and Spasms Heat and stretches as demonstrated Muscle cramps and spasms occur when a muscle or muscles tighten and you have no control over this tightening (involuntary muscle contraction). They are a common problem and can develop in any muscle. The most common place is in the calf muscles of the leg. Both muscle cramps and muscle spasms are involuntary muscle contractions, but they also have differences:   Muscle cramps are sporadic and painful. They may last a few seconds to a quarter of an hour. Muscle cramps are often more forceful and last longer than muscle spasms.  Muscle spasms may or may not be painful. They may also last just a few seconds or much longer. CAUSES  It is uncommon for cramps or spasms to be due to  a serious underlying problem. In many cases, the cause of cramps or spasms is unknown. Some common causes are:   Overexertion.   Overuse from repetitive motions (doing the same thing over and over).   Remaining in a certain position  for a long period of time.   Improper preparation, form, or technique while performing a sport or activity.   Dehydration.   Injury.   Side effects of some medicines.   Abnormally low levels of the salts and ions in your blood (electrolytes), especially potassium and calcium. This could happen if you are taking water pills (diuretics) or you are pregnant.  Some underlying medical problems can make it more likely to develop cramps or spasms. These include, but are not limited to:   Diabetes.   Parkinson disease.   Hormone disorders, such as thyroid problems.   Alcohol abuse.   Diseases specific to muscles, joints, and bones.   Blood vessel disease where not enough blood is getting to the muscles.  HOME CARE INSTRUCTIONS   Stay well hydrated. Drink enough water and fluids to keep your urine clear or pale yellow.  It may be helpful to massage, stretch, and relax the affected muscle.  For tight or tense muscles, use a warm towel, heating pad, or hot shower water directed to the affected area.  If you are sore or have pain after a cramp or spasm, applying ice to the affected area may relieve discomfort.  Put ice in a plastic bag.  Place a towel between your skin and the bag.  Leave the ice on for 15-20 minutes, 03-04 times a day.  Medicines used to treat a known cause of cramps or spasms may help reduce their frequency or severity. Only take over-the-counter or prescription medicines as directed by your caregiver. SEEK MEDICAL CARE IF:  Your cramps or spasms get more severe, more frequent, or do not improve over time.  MAKE SURE YOU:   Understand these instructions.  Will watch your condition.  Will get help right away if you are not doing well or get worse. Document Released: 06/14/2001 Document Revised: 04/19/2012 Document Reviewed: 12/10/2011 [facility] Patient Information 2015 [place], [place]. This information is not intended to replace advice given to you  by your health care provider. Make sure you discuss any questions you have with your health care provider.

## 2013-08-05 NOTE — ED Provider Notes (Signed)
Medical screening examination/treatment/procedure(s) were performed by a resident physician or non-physician practitioner and as the supervising physician I was immediately available for consultation/collaboration.  Evan Corey, MD    Evan S Corey, MD 08/05/13 0742 

## 2013-08-06 ENCOUNTER — Encounter (HOSPITAL_COMMUNITY): Payer: Self-pay | Admitting: Emergency Medicine

## 2013-08-06 ENCOUNTER — Emergency Department (HOSPITAL_COMMUNITY)
Admission: EM | Admit: 2013-08-06 | Discharge: 2013-08-06 | Disposition: A | Discharge status: Home / Self Care | Payer: PRIVATE HEALTH INSURANCE | Attending: Emergency Medicine | Admitting: Emergency Medicine

## 2013-08-06 DIAGNOSIS — G4700 Insomnia, unspecified: Secondary | ICD-10-CM | POA: Insufficient documentation

## 2013-08-06 DIAGNOSIS — Z79899 Other long term (current) drug therapy: Secondary | ICD-10-CM | POA: Insufficient documentation

## 2013-08-06 DIAGNOSIS — Z8739 Personal history of other diseases of the musculoskeletal system and connective tissue: Secondary | ICD-10-CM | POA: Insufficient documentation

## 2013-08-06 DIAGNOSIS — J019 Acute sinusitis, unspecified: Secondary | ICD-10-CM

## 2013-08-06 MED ORDER — PSEUDOEPHEDRINE HCL 30 MG PO TABS
30.0000 mg | ORAL_TABLET | Freq: Once | ORAL | Status: AC
  Administered 2013-08-06: 30 mg via ORAL
  Filled 2013-08-06: qty 1

## 2013-08-06 MED ORDER — TRIAMCINOLONE ACETONIDE 55 MCG/ACT NA AERO
1.0000 | INHALATION_SPRAY | Freq: Every day | NASAL | Status: DC
  Administered 2013-08-06: 1 via NASAL
  Filled 2013-08-06: qty 10.8

## 2013-08-06 NOTE — ED Notes (Signed)
Persistent cough, sinus congestion, reddened sclera.

## 2013-08-06 NOTE — Discharge Instructions (Signed)
As discussed, it is important to take the parotid medicine as directed: 2 sprays daily for 5 days. In addition, please obtain the previously prescribed medication, and if you are unable to do so, please obtain a generic decongestant, such as Sudafed, and use as directed.  Please be sure to follow up with their primary care physician or the [facility] in 2 days

## 2013-08-06 NOTE — ED Provider Notes (Signed)
CSN: [id number]     Arrival date & time 08/06/13  0239 History   First MD Initiated Contact with Patient 08/06/13 0253     Chief Complaint  Patient presents with  . Cough  . Insomnia      HPI Patient presents concerned ongoing sinus drainage, mild cough, with supine positioning, largely. Symptoms been present for approximately 3 days. Patient was seen in urgent care 2 days ago, but has not filled his prescriptions since that time. It is diffuse, chills, syncope, chest pain, dyspnea. He states that he is generally well.    Past Medical History  Diagnosis Date  . Degenerative cervical disc    Past Surgical History  Procedure Laterality Date  . Cervical disc surgery     History reviewed. No pertinent family history. History  Substance Use Topics  . Smoking status: Never Smoker   . Smokeless tobacco: Not on file  . Alcohol Use: No    Review of Systems  All other systems reviewed and are negative.     Allergies  Other and Shellfish allergy  Home Medications   Prior to Admission medications   Medication Sig Start Date End Date Taking? Authorizing Provider  EPINEPHrine (EPIPEN) 0.3 mg/0.3 mL IJ SOAJ injection Inject into the muscle as needed (for allergic reaction).   Yes Historical Provider, MD  nitroGLYCERIN (NITROSTAT) 0.4 MG SL tablet Place 0.4 mg under the tongue every 5 (five) minutes as needed for chest pain.   Yes Historical Provider, MD   BP 131/69  Pulse 102  Temp(Src) 98.1 F (36.7 C) (Oral)  Resp 14  Ht 5\' 11"  (1.803 m)  Wt 180 lb (81.647 kg)  BMI 25.12 kg/m2  SpO2 98% Physical Exam  Nursing note and vitals reviewed. Constitutional: He is oriented to person, place, and time. He appears well-developed. No distress.  HENT:  Head: Normocephalic and atraumatic.  Right Ear: Tympanic membrane normal.  Left Ear: No drainage or tenderness. No mastoid tenderness. Tympanic membrane is not injected and not erythematous. A middle ear effusion is present. No  hemotympanum.  Mouth/Throat: Uvula is midline, oropharynx is clear and moist and mucous membranes are normal.  Eyes: Conjunctivae and EOM are normal.  Cardiovascular: Normal rate and regular rhythm.   Pulmonary/Chest: Effort normal. No stridor. No respiratory distress.  Abdominal: He exhibits no distension.  Musculoskeletal: He exhibits no edema.  Neurological: He is alert and oriented to person, place, and time.  Skin: Skin is warm and dry.  Psychiatric: He has a normal mood and affect.    ED Course  Procedures (including critical care time) I reviewed the patient's chart, including recent urgent care visit.   4:51 AM PAtient in no distress MDM  Patient presents with sinus discharge, supine cough. Patient has no vital sign abnormalities, is awake, alert, in no distress.  Low suspicion for infection, bacterial. Patient was provided an inhaler to assist with his sinus congestion, provider referral to our trauma center to obtain assistance with obtaining medications, and discharged in stable condition.  Gerhard Munchobert Merridith Dershem, MD 08/06/13 [phone]

## 2013-08-06 NOTE — ED Notes (Signed)
Called pharmacy, they will semd sudafed and nasacort, or substitute if nasacort isn't available.

## 2013-08-07 ENCOUNTER — Encounter (HOSPITAL_COMMUNITY): Payer: Self-pay | Admitting: Emergency Medicine

## 2013-08-07 ENCOUNTER — Emergency Department (HOSPITAL_COMMUNITY)
Admission: EM | Admit: 2013-08-07 | Discharge: 2013-08-07 | Disposition: A | Discharge status: Home / Self Care | Payer: PRIVATE HEALTH INSURANCE | Attending: Emergency Medicine | Admitting: Emergency Medicine

## 2013-08-07 DIAGNOSIS — H109 Unspecified conjunctivitis: Secondary | ICD-10-CM

## 2013-08-07 DIAGNOSIS — J069 Acute upper respiratory infection, unspecified: Secondary | ICD-10-CM | POA: Diagnosis not present

## 2013-08-07 DIAGNOSIS — H5789 Other specified disorders of eye and adnexa: Secondary | ICD-10-CM | POA: Diagnosis present

## 2013-08-07 MED ORDER — POLYMYXIN B-TRIMETHOPRIM 10000-0.1 UNIT/ML-% OP SOLN: 1.0000 [drp] | OPHTHALMIC | Status: DC

## 2013-08-07 NOTE — ED Notes (Signed)
Pt reports eye drainage that glued his eye shut this AM.

## 2013-08-07 NOTE — ED Provider Notes (Signed)
CSN: [id number]     Arrival date & time 08/07/13  0720 History   First MD Initiated Contact with Patient 08/07/13 [phone]     Chief Complaint  Patient presents with  . Eye Drainage     (Consider location/radiation/quality/duration/timing/severity/associated sxs/prior Treatment) HPI  Adrian Cantu is a 48 y.o. male complaining of a burning right eye irritation onset yesterday worsening today. Patient woke up with eyes crusted shut. Cannot describe the discharge. Associated symptoms of cough and rhinorrhea. Patient denies any change in vision, trauma to the eye, contact lens use, fever, chills, headache, sick contacts, chest pain, shortness of breath, nausea vomiting, change in bowel or bladder habits.  Past Medical History  Diagnosis Date  . Degenerative cervical disc    Past Surgical History  Procedure Laterality Date  . Cervical disc surgery     History reviewed. No pertinent family history. History  Substance Use Topics  . Smoking status: Never Smoker   . Smokeless tobacco: Not on file  . Alcohol Use: No    Review of Systems  10 systems reviewed and found to be negative, except as noted in the HPI.    Allergies  Other and Shellfish allergy  Home Medications   Prior to Admission medications   Medication Sig Start Date End Date Taking? Authorizing Provider  EPINEPHrine (EPIPEN) 0.3 mg/0.3 mL IJ SOAJ injection Inject into the muscle as needed (for allergic reaction).    Historical Provider, MD  nitroGLYCERIN (NITROSTAT) 0.4 MG SL tablet Place 0.4 mg under the tongue every 5 (five) minutes as needed for chest pain.    Historical Provider, MD  trimethoprim-polymyxin b (POLYTRIM) ophthalmic solution Place 1 drop into the right eye every 4 (four) hours. for 10 days 08/07/13   Joni Reining Shalyn Koral, PA-C   BP 122/76  Pulse 80  Temp(Src) 98.2 F (36.8 C) (Oral)  Resp 20  SpO2 98% Physical Exam  Nursing note and vitals reviewed. Constitutional: He is oriented to person, place, and  time. He appears well-developed and well-nourished. No distress.  HENT:  Head: Normocephalic.  Mouth/Throat: Oropharynx is clear and moist.  Eyes: EOM and lids are normal. Pupils are equal, round, and reactive to light. Left eye exhibits no exudate. Right conjunctiva is injected. No scleral icterus. Right eye exhibits normal extraocular motion. Left eye exhibits normal extraocular motion.  Visual Acuity - Bilateral Distance: 20/20 ; R Distance: 20/20 ; L Distance: 20/20  Crusting to right upper eyelashes. No overt discharge.  Neck: Normal range of motion.  Cardiovascular: Normal rate, regular rhythm and intact distal pulses.   Pulmonary/Chest: Effort normal and breath sounds normal. No stridor. No respiratory distress. He has no wheezes. He has no rales. He exhibits no tenderness.  Abdominal: Soft. There is no tenderness.  Musculoskeletal: Normal range of motion.  Neurological: He is alert and oriented to person, place, and time.  Psychiatric: He has a normal mood and affect.    ED Course  Procedures (including critical care time) Labs Review Labs Reviewed - No data to display  Imaging Review No results found.   EKG Interpretation None      MDM   Final diagnoses:  Conjunctivitis of right eye  URI (upper respiratory infection)   Filed Vitals:   08/07/13 0724  BP: 122/76  Pulse: 80  Temp: 98.2 F (36.8 C)  TempSrc: Oral  Resp: 20  SpO2: 98%    Medications - No data to display  Adrian Cantu is a 48 y.o. male presenting with right eye  injection in discharge, crusted shut this a.m. No signs of systemic infection, normal visceral acuity. Patient does not wear contact lenses. Patient also has cough and rhinorrhea. Lung sounds are clear to auscultation, saturating well on room air. Normal vital signs. Will treat with a right-sided conjunctivitis with Polytrim. Patient states that [place] [facility] [place] but he will see tomorrow. Return precautions are  discussed.   Evaluation does not show pathology that would require ongoing emergent intervention or inpatient treatment. Pt is hemodynamically stable and mentating appropriately. Discussed findings and plan with patient/guardian, who agrees with care plan. All questions answered. Return precautions discussed and outpatient follow up given.   New Prescriptions   TRIMETHOPRIM-POLYMYXIN B (POLYTRIM) OPHTHALMIC SOLUTION    Place 1 drop into the right eye every 4 (four) hours. for 10 days         Wynetta Emeryicole Brittainy Bucker, PA-C 08/07/13 [phone]

## 2013-08-07 NOTE — ED Notes (Addendum)
Declined W/C at D/C and was escorted to lobby by RN. 

## 2013-08-07 NOTE — ED Provider Notes (Signed)
Medical screening examination/treatment/procedure(s) were performed by non-physician practitioner and as supervising physician I was immediately available for consultation/collaboration.   EKG Interpretation None       Doug SouSam Uno Esau, MD 08/07/13 1820

## 2013-08-07 NOTE — Discharge Instructions (Signed)
°  Follow with the eye doctor in the next 24 to 28 hours  Do not reuse your contact lenses and do not use any contact lenses until you are cleared by the eye doctor.   Wash your hands frequently and try to keep your hands away from the affected eye(s).   You should be feeling some improvement by 48 hours. If symptoms worsen, you develop pain, change in your vision or no improvement in 48 hours please follow with the ophthalmologist or, if that is not possible, return to the emergency room for a recheck.   Conjunctivitis Conjunctivitis is commonly called "pink eye." Conjunctivitis can be caused by bacterial or viral infection, allergies, or injuries. There is usually redness of the lining of the eye, itching, discomfort, and sometimes discharge. There may be deposits of matter along the eyelids. A viral infection usually causes a watery discharge, while a bacterial infection causes a yellowish, thick discharge. Pink eye is very contagious and spreads by direct contact. You may be given antibiotic eyedrops as part of your treatment. Before using your eye medicine, remove all drainage from the eye by washing gently with warm water and cotton balls. Continue to use the medication until you have awakened 2 mornings in a row without discharge from the eye. Do not rub your eye. This increases the irritation and helps spread infection. Use separate towels from other household members. Wash your hands with soap and water before and after touching your eyes. Use cold compresses to reduce pain and sunglasses to relieve irritation from light. Do not wear contact lenses or wear eye makeup until the infection is gone. SEEK MEDICAL CARE IF:   Your symptoms are not better after 3 days of treatment.  You have increased pain or trouble seeing.  The outer eyelids become very red or swollen. Document Released: 01/31/2004 Document Revised: 03/17/2011 Document Reviewed: 12/23/2004 [facility] Patient Information 2015  [place], [place]. This information is not intended to replace advice given to you by your health care provider. Make sure you discuss any questions you have with your health care provider.

## 2016-04-29 HISTORY — PX: COLONOSCOPY: SHX174

## 2016-04-29 HISTORY — PX: ESOPHAGOGASTRODUODENOSCOPY: SHX1529

## 2016-09-04 ENCOUNTER — Ambulatory Visit (HOSPITAL_COMMUNITY)
Admission: EM | Admit: 2016-09-04 | Discharge: 2016-09-04 | Disposition: A | Discharge status: Home / Self Care | Payer: Medicare Other | Attending: Family Medicine | Admitting: Family Medicine

## 2016-09-04 ENCOUNTER — Encounter (HOSPITAL_COMMUNITY): Payer: Self-pay | Admitting: Emergency Medicine

## 2016-09-04 DIAGNOSIS — S39012A Strain of muscle, fascia and tendon of lower back, initial encounter: Secondary | ICD-10-CM | POA: Diagnosis not present

## 2016-09-04 DIAGNOSIS — M545 Low back pain: Secondary | ICD-10-CM | POA: Diagnosis not present

## 2016-09-04 HISTORY — DX: Acute embolism and thrombosis of unspecified deep veins of unspecified proximal lower extremity: I82.4Y9

## 2016-09-04 MED ORDER — CYCLOBENZAPRINE HCL 10 MG PO TABS: 10.0000 mg | ORAL_TABLET | Freq: Two times a day (BID) | ORAL | 0 refills | Status: DC | PRN

## 2016-09-04 NOTE — ED Triage Notes (Addendum)
Pt reports mid lower back pain when he bends over and when he tries to get up from lying down or sitting down.    Pt reports starting Eliquis in April.  He also takes cholesterol and depression medication but he does not know the names.

## 2016-09-04 NOTE — ED Provider Notes (Signed)
  [facility]   [id number] 09/04/16 Arrival Time: 1000  ASSESSMENT & PLAN:  1. Strain of lumbar region, initial encounter     Meds ordered this encounter  Medications  . apixaban (ELIQUIS) 5 MG TABS tablet    Sig: Take 5 mg by mouth 2 (two) times daily.  . cyclobenzaprine (FLEXERIL) 10 MG tablet    Sig: Take 1 tablet (10 mg total) by mouth 2 (two) times daily as needed for muscle spasms.    Dispense:  20 tablet    Refill:  0    Order Specific Question:   Supervising Provider    Answer:   Mardella LaymanHAGLER, BRIAN [[id number]]    Reviewed expectations re: course of current medical issues. Questions answered. Outlined signs and symptoms indicating need for more acute intervention. Patient verbalized understanding. After Visit Summary given.   SUBJECTIVE:  Preston FleetingRoy Scheid is a 51 y.o. male who presents with complaint of back pain  ROS: As per HPI.   OBJECTIVE:  Vitals:   09/04/16 1026  BP: (!) 119/53  Pulse: 62  Resp: 16  Temp: 97.7 F (36.5 C)  TempSrc: Oral  SpO2: 100%    General appearance: alert; no distress Eyes: PERRLA; EOMI; conjunctiva normal HENT: normocephalic; atraumatic; Lungs: clear to auscultation bilaterally Heart: regular rate and rhythm Abdomen: soft, non-tender; bowel sounds normal; no masses or organomegaly; no guarding or rebound tenderness Back: TTP lumbar spine paraspinous muscles  Labs:  Labs Reviewed - No data to display  Imaging: No results found.  Allergies  Allergen Reactions  . Other Rash    Apple juice  . Shellfish Allergy Rash    Past Medical History:  Diagnosis Date  . Degenerative cervical disc   . 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 Marital status: Single    Spouse name: N/A  . Number of children: N/A  . Years of education: N/A   Occupational History  . Not on file.   Social History Main Topics  . Smoking status: Never Smoker  . Smokeless tobacco: Never Used    . Alcohol use No  . Drug use: No  . Sexual activity: Yes    Birth control/ protection: Condom   Other Topics Concern  . Not on file   Social History Narrative  . No narrative on file   History reviewed. No pertinent family history. Past Surgical History:  Procedure Laterality Date  . BACK SURGERY    . CERVICAL DISC SURGERY       Deatra CanterOxford, Fate Caster J, OregonFNP 09/04/16 1124

## 2016-09-23 DIAGNOSIS — M79605 Pain in left leg: Secondary | ICD-10-CM | POA: Diagnosis not present

## 2016-09-24 ENCOUNTER — Other Ambulatory Visit (HOSPITAL_COMMUNITY): Payer: Self-pay | Admitting: Family Medicine

## 2016-09-24 DIAGNOSIS — M79604 Pain in right leg: Secondary | ICD-10-CM

## 2016-09-24 DIAGNOSIS — M79605 Pain in left leg: Secondary | ICD-10-CM

## 2016-09-26 ENCOUNTER — Inpatient Hospital Stay (HOSPITAL_COMMUNITY): Admission: RE | Admit: 2016-09-26 | Payer: Self-pay | Source: Ambulatory Visit

## 2016-09-29 ENCOUNTER — Ambulatory Visit (HOSPITAL_COMMUNITY)
Admission: RE | Admit: 2016-09-29 | Discharge: 2016-09-29 | Disposition: A | Discharge status: Home / Self Care | Payer: Medicare Other | Source: Ambulatory Visit | Attending: Family Medicine | Admitting: Family Medicine

## 2016-09-29 DIAGNOSIS — M79605 Pain in left leg: Secondary | ICD-10-CM

## 2016-09-29 DIAGNOSIS — M79604 Pain in right leg: Secondary | ICD-10-CM

## 2016-10-07 DIAGNOSIS — I82403 Acute embolism and thrombosis of unspecified deep veins of lower extremity, bilateral: Secondary | ICD-10-CM | POA: Diagnosis not present

## 2016-10-07 DIAGNOSIS — I2699 Other pulmonary embolism without acute cor pulmonale: Secondary | ICD-10-CM | POA: Diagnosis not present

## 2016-10-20 DIAGNOSIS — R079 Chest pain, unspecified: Secondary | ICD-10-CM | POA: Diagnosis not present

## 2016-10-20 DIAGNOSIS — M79661 Pain in right lower leg: Secondary | ICD-10-CM | POA: Diagnosis not present

## 2016-10-20 DIAGNOSIS — R12 Heartburn: Secondary | ICD-10-CM | POA: Diagnosis not present

## 2016-10-20 DIAGNOSIS — Z5320 Procedure and treatment not carried out because of patient's decision for unspecified reasons: Secondary | ICD-10-CM | POA: Diagnosis not present

## 2016-10-20 DIAGNOSIS — R42 Dizziness and giddiness: Secondary | ICD-10-CM | POA: Diagnosis not present

## 2016-10-25 DIAGNOSIS — R079 Chest pain, unspecified: Secondary | ICD-10-CM | POA: Diagnosis not present

## 2016-10-27 DIAGNOSIS — I2699 Other pulmonary embolism without acute cor pulmonale: Secondary | ICD-10-CM | POA: Diagnosis not present

## 2016-10-28 DIAGNOSIS — R0602 Shortness of breath: Secondary | ICD-10-CM | POA: Diagnosis not present

## 2016-10-28 DIAGNOSIS — Z79899 Other long term (current) drug therapy: Secondary | ICD-10-CM | POA: Diagnosis not present

## 2016-10-28 DIAGNOSIS — Z7901 Long term (current) use of anticoagulants: Secondary | ICD-10-CM | POA: Diagnosis not present

## 2016-10-28 DIAGNOSIS — Z86711 Personal history of pulmonary embolism: Secondary | ICD-10-CM | POA: Diagnosis not present

## 2016-10-28 DIAGNOSIS — R079 Chest pain, unspecified: Secondary | ICD-10-CM | POA: Diagnosis not present

## 2016-10-28 DIAGNOSIS — Z86718 Personal history of other venous thrombosis and embolism: Secondary | ICD-10-CM | POA: Diagnosis not present

## 2016-10-28 DIAGNOSIS — E785 Hyperlipidemia, unspecified: Secondary | ICD-10-CM | POA: Diagnosis not present

## 2016-11-03 DIAGNOSIS — R1031 Right lower quadrant pain: Secondary | ICD-10-CM | POA: Diagnosis not present

## 2016-11-03 DIAGNOSIS — R05 Cough: Secondary | ICD-10-CM | POA: Diagnosis not present

## 2016-11-03 DIAGNOSIS — R079 Chest pain, unspecified: Secondary | ICD-10-CM | POA: Diagnosis not present

## 2016-11-03 DIAGNOSIS — I2699 Other pulmonary embolism without acute cor pulmonale: Secondary | ICD-10-CM | POA: Diagnosis not present

## 2016-11-03 DIAGNOSIS — R509 Fever, unspecified: Secondary | ICD-10-CM | POA: Diagnosis not present

## 2016-11-03 DIAGNOSIS — R0789 Other chest pain: Secondary | ICD-10-CM | POA: Diagnosis not present

## 2016-11-05 DIAGNOSIS — I2699 Other pulmonary embolism without acute cor pulmonale: Secondary | ICD-10-CM | POA: Diagnosis not present

## 2016-11-05 DIAGNOSIS — R0789 Other chest pain: Secondary | ICD-10-CM | POA: Diagnosis not present

## 2016-11-08 DIAGNOSIS — R0789 Other chest pain: Secondary | ICD-10-CM | POA: Diagnosis not present

## 2016-11-08 DIAGNOSIS — N281 Cyst of kidney, acquired: Secondary | ICD-10-CM | POA: Diagnosis not present

## 2016-11-08 DIAGNOSIS — R079 Chest pain, unspecified: Secondary | ICD-10-CM | POA: Diagnosis not present

## 2016-11-08 DIAGNOSIS — M2550 Pain in unspecified joint: Secondary | ICD-10-CM | POA: Diagnosis not present

## 2016-11-08 DIAGNOSIS — Z86711 Personal history of pulmonary embolism: Secondary | ICD-10-CM | POA: Diagnosis not present

## 2016-11-10 DIAGNOSIS — R042 Hemoptysis: Secondary | ICD-10-CM | POA: Diagnosis not present

## 2016-11-10 DIAGNOSIS — K921 Melena: Secondary | ICD-10-CM | POA: Diagnosis not present

## 2016-11-10 DIAGNOSIS — R7989 Other specified abnormal findings of blood chemistry: Secondary | ICD-10-CM | POA: Diagnosis not present

## 2016-11-11 DIAGNOSIS — K921 Melena: Secondary | ICD-10-CM | POA: Diagnosis not present

## 2016-11-13 DIAGNOSIS — R0789 Other chest pain: Secondary | ICD-10-CM | POA: Diagnosis not present

## 2016-11-13 DIAGNOSIS — I2699 Other pulmonary embolism without acute cor pulmonale: Secondary | ICD-10-CM | POA: Diagnosis not present

## 2016-11-26 DIAGNOSIS — I2699 Other pulmonary embolism without acute cor pulmonale: Secondary | ICD-10-CM | POA: Diagnosis not present

## 2016-11-26 DIAGNOSIS — R079 Chest pain, unspecified: Secondary | ICD-10-CM | POA: Diagnosis not present

## 2016-12-01 DIAGNOSIS — Z8639 Personal history of other endocrine, nutritional and metabolic disease: Secondary | ICD-10-CM | POA: Diagnosis not present

## 2016-12-01 DIAGNOSIS — Z86718 Personal history of other venous thrombosis and embolism: Secondary | ICD-10-CM | POA: Diagnosis not present

## 2016-12-01 DIAGNOSIS — M79605 Pain in left leg: Secondary | ICD-10-CM | POA: Diagnosis not present

## 2016-12-01 DIAGNOSIS — I2699 Other pulmonary embolism without acute cor pulmonale: Secondary | ICD-10-CM | POA: Diagnosis not present

## 2016-12-04 DIAGNOSIS — Z86718 Personal history of other venous thrombosis and embolism: Secondary | ICD-10-CM | POA: Diagnosis not present

## 2016-12-04 DIAGNOSIS — M79605 Pain in left leg: Secondary | ICD-10-CM | POA: Diagnosis not present

## 2016-12-10 DIAGNOSIS — R0789 Other chest pain: Secondary | ICD-10-CM | POA: Diagnosis not present

## 2016-12-10 DIAGNOSIS — M79604 Pain in right leg: Secondary | ICD-10-CM | POA: Diagnosis not present

## 2016-12-10 DIAGNOSIS — Z86718 Personal history of other venous thrombosis and embolism: Secondary | ICD-10-CM | POA: Diagnosis not present

## 2016-12-10 DIAGNOSIS — E785 Hyperlipidemia, unspecified: Secondary | ICD-10-CM | POA: Diagnosis not present

## 2016-12-10 DIAGNOSIS — R7309 Other abnormal glucose: Secondary | ICD-10-CM | POA: Diagnosis not present

## 2016-12-10 DIAGNOSIS — Z8639 Personal history of other endocrine, nutritional and metabolic disease: Secondary | ICD-10-CM | POA: Diagnosis not present

## 2016-12-10 DIAGNOSIS — M79605 Pain in left leg: Secondary | ICD-10-CM | POA: Diagnosis not present

## 2016-12-10 DIAGNOSIS — I2699 Other pulmonary embolism without acute cor pulmonale: Secondary | ICD-10-CM | POA: Diagnosis not present

## 2016-12-25 DIAGNOSIS — M791 Myalgia, unspecified site: Secondary | ICD-10-CM | POA: Diagnosis not present

## 2017-01-01 DIAGNOSIS — Z86718 Personal history of other venous thrombosis and embolism: Secondary | ICD-10-CM | POA: Diagnosis not present

## 2017-01-01 DIAGNOSIS — I2699 Other pulmonary embolism without acute cor pulmonale: Secondary | ICD-10-CM | POA: Diagnosis not present

## 2017-01-01 DIAGNOSIS — E785 Hyperlipidemia, unspecified: Secondary | ICD-10-CM | POA: Diagnosis not present

## 2017-01-01 DIAGNOSIS — Z590 Homelessness: Secondary | ICD-10-CM | POA: Diagnosis not present

## 2017-01-01 DIAGNOSIS — M7989 Other specified soft tissue disorders: Secondary | ICD-10-CM | POA: Diagnosis not present

## 2017-01-05 DIAGNOSIS — I2699 Other pulmonary embolism without acute cor pulmonale: Secondary | ICD-10-CM | POA: Diagnosis not present

## 2017-01-05 DIAGNOSIS — Z86718 Personal history of other venous thrombosis and embolism: Secondary | ICD-10-CM | POA: Diagnosis not present

## 2019-01-18 ENCOUNTER — Emergency Department (HOSPITAL_COMMUNITY)
Admission: EM | Admit: 2019-01-18 | Discharge: 2019-01-19 | Discharge status: Against Medical Advice | Payer: Medicare Other | Attending: Emergency Medicine | Admitting: Emergency Medicine

## 2019-01-18 ENCOUNTER — Emergency Department (HOSPITAL_COMMUNITY): Payer: Medicare Other

## 2019-01-18 ENCOUNTER — Encounter (HOSPITAL_COMMUNITY): Payer: Self-pay | Admitting: Emergency Medicine

## 2019-01-18 DIAGNOSIS — R519 Headache, unspecified: Secondary | ICD-10-CM | POA: Diagnosis present

## 2019-01-18 DIAGNOSIS — Z5321 Procedure and treatment not carried out due to patient leaving prior to being seen by health care provider: Secondary | ICD-10-CM | POA: Diagnosis not present

## 2019-01-18 LAB — BASIC METABOLIC PANEL
Anion gap: 8 (ref 5–15)
BUN: 10 mg/dL (ref 6–20)
CO2: 26 mmol/L (ref 22–32)
Calcium: 9.7 mg/dL (ref 8.9–10.3)
Chloride: 109 mmol/L (ref 98–111)
Creatinine, Ser: 1.27 mg/dL — ABNORMAL HIGH (ref 0.61–1.24)
GFR calc Af Amer: >60 mL/min (ref >60)
GFR calc non Af Amer: >60 mL/min (ref >60)
Glucose, Bld: 97 mg/dL (ref 70–99)
Potassium: 4.7 mmol/L (ref 3.5–5.1)
Sodium: 143 mmol/L (ref 135–145)

## 2019-01-18 LAB — CBC
HCT: 48.8 % (ref 39.0–52.0)
Hemoglobin: 15.6 g/dL (ref 13.0–17.0)
MCH: 26 pg (ref 26.0–34.0)
MCHC: 32 g/dL (ref 30.0–36.0)
MCV: 81.2 fL (ref 80.0–100.0)
Platelets: 169 10*3/uL (ref 150–400)
RBC: 6.01 MIL/uL — ABNORMAL HIGH (ref 4.22–5.81)
RDW: 15.1 % (ref 11.5–15.5)
WBC: 3.1 10*3/uL — ABNORMAL LOW (ref 4.0–10.5)
nRBC: 0 % (ref 0.0–0.2)

## 2019-01-18 LAB — TROPONIN I (HIGH SENSITIVITY): Troponin I (High Sensitivity): 2 ng/L (ref <18)

## 2019-01-18 MED ORDER — SODIUM CHLORIDE 0.9% FLUSH: 3.0000 mL | Freq: Once | INTRAVENOUS | Status: DC

## 2019-01-18 NOTE — ED Notes (Signed)
Pt called x3 for vitals, no answer 

## 2019-01-18 NOTE — ED Triage Notes (Signed)
Pt states 5 days ago he started having a headache and on Saturday he also felt like his vision was blurry pt states this seemed to resolved after eating but now is also having right sided chest pain and pain in his right arm. Pt is alert and ox4. Pt currently has no cp but does have headache.

## 2019-01-18 NOTE — ED Notes (Signed)
Called pt for vitals, no answer x1 

## 2019-02-02 ENCOUNTER — Emergency Department (HOSPITAL_COMMUNITY)
Admission: EM | Admit: 2019-02-02 | Discharge: 2019-02-02 | Disposition: A | Discharge status: Home / Self Care | Payer: Medicare Other | Attending: Emergency Medicine | Admitting: Emergency Medicine

## 2019-02-02 ENCOUNTER — Emergency Department (HOSPITAL_COMMUNITY): Payer: Medicare Other

## 2019-02-02 ENCOUNTER — Encounter (HOSPITAL_COMMUNITY): Payer: Self-pay | Admitting: Emergency Medicine

## 2019-02-02 DIAGNOSIS — M79601 Pain in right arm: Secondary | ICD-10-CM

## 2019-02-02 DIAGNOSIS — Z86718 Personal history of other venous thrombosis and embolism: Secondary | ICD-10-CM | POA: Diagnosis not present

## 2019-02-02 DIAGNOSIS — M79621 Pain in right upper arm: Secondary | ICD-10-CM | POA: Insufficient documentation

## 2019-02-02 DIAGNOSIS — Z7901 Long term (current) use of anticoagulants: Secondary | ICD-10-CM | POA: Insufficient documentation

## 2019-02-02 NOTE — Discharge Instructions (Addendum)
Take over-the-counter medications as needed for pain, follow-up with an orthopedic doctor, sports medicine or your primary care doctor for further evaluation

## 2019-02-02 NOTE — ED Provider Notes (Signed)
[facility] EMERGENCY DEPARTMENT Provider Note   CSN: [id number] Arrival date & time: 02/02/19  7371     History Chief Complaint  Patient presents with  . Arm Injury    Adrian Cantu is a 54 y.o. male.  HPI   Patient presented to the ER for evaluation of right elbow pain.  Patient states the symptoms started about a month ago.  He has been having pain in his elbow region.  The pain increases with certain movement.  It goes up towards the upper arm elbow.  No recent falls or injuries.  No numbness or weakness or neck pain  Past Medical History:  Diagnosis Date  . Degenerative cervical disc   . 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   Past Surgical History:  Procedure Laterality Date  . BACK SURGERY    . CERVICAL DISC SURGERY         No family history on file.  Social History   Tobacco Use  . Smoking status: Never Smoker  . Smokeless tobacco: Never Used  Substance Use Topics  . Alcohol use: No  . Drug use: No    Home Medications Prior to Admission medications   Medication Sig Start Date End Date Taking? Authorizing Provider  apixaban (ELIQUIS) 5 MG TABS tablet Take 5 mg by mouth 2 (two) times daily.    [provider]  cyclobenzaprine (FLEXERIL) 10 MG tablet Take 1 tablet (10 mg total) by mouth 2 (two) times daily as needed for muscle spasms. 09/04/16   Lysbeth Penner, FNP  EPINEPHrine (EPIPEN) 0.3 mg/0.3 mL IJ SOAJ injection Inject into the muscle as needed (for allergic reaction).    [provider]  nitroGLYCERIN (NITROSTAT) 0.4 MG SL tablet Place 0.4 mg under the tongue every 5 (five) minutes as needed for chest pain.    [provider]  trimethoprim-polymyxin b (POLYTRIM) ophthalmic solution Place 1 drop into the right eye every 4 (four) hours. for 10 days 08/07/13   Pisciotta, Elmyra Ricks, PA-C    Allergies    Other and Shellfish allergy  Review of  Systems   Review of Systems  All other systems reviewed and are negative.   Physical Exam Updated Vital Signs BP 122/61 (BP Location: Right Arm)   Pulse 71   Temp 97.8 F (36.6 C) (Oral)   Resp 14   SpO2 100%   Physical Exam Vitals and nursing note reviewed.  Constitutional:      General: He is not in acute distress.    Appearance: He is well-developed.  HENT:     Head: Normocephalic and atraumatic.     Right Ear: External ear normal.     Left Ear: External ear normal.  Eyes:     General: No scleral icterus.       Right eye: No discharge.        Left eye: No discharge.     Conjunctiva/sclera: Conjunctivae normal.  Neck:     Trachea: No tracheal deviation.  Cardiovascular:     Rate and Rhythm: Normal rate.  Pulmonary:     Effort: Pulmonary effort is normal. No respiratory distress.     Breath sounds: No stridor.  Abdominal:     General: There is no distension.  Musculoskeletal:        General: No swelling or deformity.     Cervical back: Neck supple.     Comments:  Mild tenderness antecubital fossa and bicep region, no bony tenderness palpation, full range of motion, no erythema or swelling  Skin:    General: Skin is warm and dry.     Findings: No rash.  Neurological:     Mental Status: He is alert.     Cranial Nerves: Cranial nerve deficit: no gross deficits.     ED Results / Procedures / Treatments   Labs (all labs ordered are listed, but only abnormal results are displayed) Labs Reviewed - No data to display  EKG None  Radiology DG Shoulder Right  Result Date: 02/02/2019 CLINICAL DATA:  Pain for approximately 1 month EXAM: RIGHT SHOULDER - 2+ VIEW COMPARISON:  None. FINDINGS: Oblique, Y scapular, and axillary images were obtained. No fracture or dislocation. Joint spaces appear normal. No erosive change or intra-articular calcification. Visualized right lung clear. IMPRESSION: No fracture or dislocation.  No evident arthropathy. Electronically Signed    By: Bretta Bang III M.D.   On: 02/02/2019 08:38   DG Elbow Complete Right  Result Date: 02/02/2019 CLINICAL DATA:  Pain for approximately 1 month EXAM: RIGHT ELBOW - COMPLETE 3+ VIEW COMPARISON:  None. FINDINGS: Frontal, lateral, and bilateral oblique views were obtained. No fracture or dislocation. No joint effusion. There is spurring along the olecranon and coracoid processes of the proximal ulna. No appreciable joint space narrowing or erosion. IMPRESSION: No fracture or dislocation. No appreciable joint space narrowing. There are proximal ulnar spurs as noted. Electronically Signed   By: Bretta Bang III M.D.   On: 02/02/2019 08:40    Procedures Procedures (including critical care time)  Medications Ordered in ED Medications - No data to display  ED Course  I have reviewed the triage vital signs and the nursing notes.  Pertinent labs & imaging results that were available during my care of the patient were reviewed by me and considered in my medical decision making (see chart for details).  Clinical Course as of Feb 02 911  Wed Feb 02, 2019  0908 X-ray results reviewed.  No fracture or dislocation   [JK]    Clinical Course User Index [JK] Linwood Dibbles, MD   MDM Rules/Calculators/A&P                      Symptoms may be related to a tendinitis.  No evidence of fracture or dislocation on x-ray.  No signs to suggest infection.  No evidence to suggest DVT.Patient can take over-the-counter medications.  Outpatient follow-up with orthopedics or sports medicine Final Clinical Impression(s) / ED Diagnoses Final diagnoses:  Pain of right upper extremity    Rx / DC Orders ED Discharge Orders    None       Linwood Dibbles, MD 02/02/19 [phone]

## 2019-02-02 NOTE — ED Triage Notes (Signed)
Pt arrives to ED with c/c of right elbow pain that started 1 month ago pt reports this just suddenly started with no prior injury. Pain sometimes radiates into right shoulder.

## 2019-02-02 NOTE — ED Notes (Signed)
Pt d/c home per MD order. Discharge summary reviewed, pt verbalizes understanding. Ambulatory off unit. No s/s of acute distress noted.  

## 2019-02-02 NOTE — ED Notes (Signed)
Pt transported to xray 

## 2019-02-11 ENCOUNTER — Encounter (HOSPITAL_COMMUNITY): Payer: Self-pay | Admitting: *Deleted

## 2019-02-11 ENCOUNTER — Emergency Department (HOSPITAL_COMMUNITY)
Admission: EM | Admit: 2019-02-11 | Discharge: 2019-02-11 | Disposition: A | Discharge status: Home / Self Care | Payer: Medicare Other | Attending: Emergency Medicine | Admitting: Emergency Medicine

## 2019-02-11 ENCOUNTER — Other Ambulatory Visit: Payer: Self-pay

## 2019-02-11 ENCOUNTER — Emergency Department (HOSPITAL_COMMUNITY): Payer: Medicare Other

## 2019-02-11 DIAGNOSIS — Z86711 Personal history of pulmonary embolism: Secondary | ICD-10-CM | POA: Diagnosis not present

## 2019-02-11 DIAGNOSIS — R631 Polydipsia: Secondary | ICD-10-CM | POA: Diagnosis not present

## 2019-02-11 DIAGNOSIS — R0602 Shortness of breath: Secondary | ICD-10-CM | POA: Insufficient documentation

## 2019-02-11 DIAGNOSIS — R0789 Other chest pain: Secondary | ICD-10-CM | POA: Insufficient documentation

## 2019-02-11 DIAGNOSIS — Z7901 Long term (current) use of anticoagulants: Secondary | ICD-10-CM | POA: Diagnosis not present

## 2019-02-11 DIAGNOSIS — R350 Frequency of micturition: Secondary | ICD-10-CM | POA: Insufficient documentation

## 2019-02-11 DIAGNOSIS — Z86718 Personal history of other venous thrombosis and embolism: Secondary | ICD-10-CM | POA: Diagnosis not present

## 2019-02-11 DIAGNOSIS — M25511 Pain in right shoulder: Secondary | ICD-10-CM | POA: Diagnosis present

## 2019-02-11 LAB — URINALYSIS, ROUTINE W REFLEX MICROSCOPIC
Bilirubin Urine: NEGATIVE
Glucose, UA: NEGATIVE mg/dL
Hgb urine dipstick: NEGATIVE
Ketones, ur: NEGATIVE mg/dL
Leukocytes,Ua: NEGATIVE
Nitrite: NEGATIVE
Protein, ur: NEGATIVE mg/dL
Specific Gravity, Urine: 1.004 — ABNORMAL LOW (ref 1.005–1.030)
pH: 7 (ref 5.0–8.0)

## 2019-02-11 LAB — BASIC METABOLIC PANEL
Anion gap: 11 (ref 5–15)
BUN: 12 mg/dL (ref 6–20)
CO2: 25 mmol/L (ref 22–32)
Calcium: 9.6 mg/dL (ref 8.9–10.3)
Chloride: 104 mmol/L (ref 98–111)
Creatinine, Ser: 1.23 mg/dL (ref 0.61–1.24)
GFR calc Af Amer: >60 mL/min (ref >60)
GFR calc non Af Amer: >60 mL/min (ref >60)
Glucose, Bld: 98 mg/dL (ref 70–99)
Potassium: 4.1 mmol/L (ref 3.5–5.1)
Sodium: 140 mmol/L (ref 135–145)

## 2019-02-11 LAB — CBC
HCT: 47.7 % (ref 39.0–52.0)
Hemoglobin: 15.1 g/dL (ref 13.0–17.0)
MCH: 25.4 pg — ABNORMAL LOW (ref 26.0–34.0)
MCHC: 31.7 g/dL (ref 30.0–36.0)
MCV: 80.2 fL (ref 80.0–100.0)
Platelets: 133 10*3/uL — ABNORMAL LOW (ref 150–400)
RBC: 5.95 MIL/uL — ABNORMAL HIGH (ref 4.22–5.81)
RDW: 14.5 % (ref 11.5–15.5)
WBC: 3.2 10*3/uL — ABNORMAL LOW (ref 4.0–10.5)
nRBC: 0 % (ref 0.0–0.2)

## 2019-02-11 LAB — TROPONIN I (HIGH SENSITIVITY)
Troponin I (High Sensitivity): <2 ng/L (ref <18)
Troponin I (High Sensitivity): 3 ng/L (ref <18)

## 2019-02-11 NOTE — Discharge Instructions (Signed)
You were seen today for chest pain, shortness of breath and increased urination and thirst. Your workup was very reassuring and did not indicate any current problems with your heart, lungs or kidneys or blood. We spoke with the cardiologist who is also reassured but would like for you to follow up in the office sometime soon. Please give their office a call to set up an appointment.  Thank you for allowing me to care for you today. Please return to the emergency department if you have new or worsening symptoms.

## 2019-02-11 NOTE — ED Provider Notes (Signed)
[place] EMERGENCY DEPARTMENT Provider Note   CSN: [id number] Arrival date & time: 02/11/19  5361     History Chief Complaint  Patient presents with  . Arm Pain  . Shoulder Pain    Adrian Cantu is a 54 y.o. male.  Patient is a 54 year old gentleman with past medical history of schizophrenia, anxiety, depression, pulmonary embolism, degenerative disc disease in the cervical spine presenting to the emergency department for multiple complaints.  Patient reports that he has been having sensations in his right arm with pain and tingling for some time now.  Patient was seen in the emergency department in January with a negative shoulder x-ray.  Follow-up with sports medicine who related an extensive plan for physical therapy, injections and MRI in the future.  Patient reports that he does not think that his arm pain is an orthopedic problem and thinks that he has heart problem instead.  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  Reports that occasionally he will get chest pain, shortness of breath and left jaw pain.  This comes on randomly and is not exertional.  Reports it usually happens when he is laying down in bed and looking at his phone on social media.  He denies any pain or symptoms now.  Patient also reports that he has excessive thirst and excessive urination.  This has been going on for some time now.  He feels that he is not being referred to the proper specialist in the [place] system for his symptoms and that is why he is here today        Past Medical History:  Diagnosis Date  . Degenerative cervical disc   . 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   Past Surgical History:  Procedure Laterality Date  . BACK SURGERY    . CERVICAL DISC SURGERY         No family history on file.  Social History   Tobacco Use  . Smoking status: Never Smoker  .  Smokeless tobacco: Never Used  Substance Use Topics  . Alcohol use: No  . Drug use: No    Home Medications Prior to Admission medications   Medication Sig Start Date End Date Taking? Authorizing Provider  apixaban (ELIQUIS) 5 MG TABS tablet Take 5 mg by mouth 2 (two) times daily.    [provider]  cyclobenzaprine (FLEXERIL) 10 MG tablet Take 1 tablet (10 mg total) by mouth 2 (two) times daily as needed for muscle spasms. 09/04/16   Lysbeth Penner, FNP  EPINEPHrine (EPIPEN) 0.3 mg/0.3 mL IJ SOAJ injection Inject into the muscle as needed (for allergic reaction).    [provider]  nitroGLYCERIN (NITROSTAT) 0.4 MG SL tablet Place 0.4 mg under the tongue every 5 (five) minutes as needed for chest pain.    [provider]  trimethoprim-polymyxin b (POLYTRIM) ophthalmic solution Place 1 drop into the right eye every 4 (four) hours. for 10 days 08/07/13   Pisciotta, Elmyra Ricks, PA-C    Allergies    Other and Shellfish allergy  Review of Systems   Review of Systems  Constitutional: Negative for chills and fever.  HENT: Negative for congestion, postnasal drip and sore throat.   Eyes: Negative for photophobia and visual disturbance.  Respiratory: Positive for cough and shortness of breath. Negative for chest tightness.   Cardiovascular: Positive for chest pain.  Negative for palpitations and leg swelling.  Gastrointestinal: Negative for abdominal pain, diarrhea, nausea and vomiting.  Endocrine: Positive for polydipsia and polyuria.  Genitourinary: Negative for decreased urine volume, difficulty urinating, dysuria and testicular pain.  Musculoskeletal: Positive for arthralgias. Negative for back pain, myalgias, neck pain and neck stiffness.  Skin: Negative for rash and wound.  Neurological: Negative for dizziness, weakness, light-headedness, numbness and headaches.    Physical Exam Updated Vital Signs BP 127/86   Pulse 84   Temp 98.2 F (36.8 C) (Oral)   Resp 16    Ht 5\' 10"  (1.778 m)   Wt 83.9 kg   SpO2 98%   BMI 26.54 kg/m   Physical Exam Vitals and nursing note reviewed.  Constitutional:      General: He is not in acute distress.    Appearance: Normal appearance. He is not ill-appearing, toxic-appearing or diaphoretic.  HENT:     Head: Normocephalic.     Mouth/Throat:     Mouth: Mucous membranes are moist.  Eyes:     Conjunctiva/sclera: Conjunctivae normal.     Pupils: Pupils are equal, round, and reactive to light.  Cardiovascular:     Rate and Rhythm: Normal rate and regular rhythm.     Pulses: Normal pulses.     Heart sounds: Normal heart sounds.  Pulmonary:     Effort: Pulmonary effort is normal.     Breath sounds: Normal breath sounds.  Abdominal:     General: Abdomen is flat. Bowel sounds are normal.     Tenderness: There is no abdominal tenderness.  Skin:    General: Skin is warm and dry.  Neurological:     General: No focal deficit present.     Mental Status: He is alert and oriented to person, place, and time.     Cranial Nerves: No cranial nerve deficit.     Sensory: No sensory deficit.     Gait: Gait normal.  Psychiatric:        Mood and Affect: Mood normal.     ED Results / Procedures / Treatments   Labs (all labs ordered are listed, but only abnormal results are displayed) Labs Reviewed  CBC - Abnormal; Notable for the following components:      Result Value   WBC 3.2 (*)    RBC 5.95 (*)    MCH 25.4 (*)    Platelets 133 (*)    All other components within normal limits  URINALYSIS, ROUTINE W REFLEX MICROSCOPIC - Abnormal; Notable for the following components:   Color, Urine STRAW (*)    Specific Gravity, Urine 1.004 (*)    All other components within normal limits  BASIC METABOLIC PANEL  TROPONIN I (HIGH SENSITIVITY)  TROPONIN I (HIGH SENSITIVITY)    EKG EKG Interpretation  Date/Time:  Friday February 11 2019 14:08:41 EST Ventricular Rate:  50 PR Interval:    QRS Duration: 106 QT  Interval:  407 QTC Calculation: 372 R Axis:   59 Text Interpretation: Sinus rhythm Inferior infarct, age indeterminate No significant change since last tracing Confirmed by 08-22-1999 [phone]) on 02/11/2019 2:10:20 PM   Radiology DG Chest Port 1 View  Result Date: 02/11/2019 CLINICAL DATA:  Chest pain. EXAM: PORTABLE CHEST 1 VIEW COMPARISON:  01/18/2019 FINDINGS: The heart size and mediastinal contours are within normal limits. Both lungs are clear. The visualized skeletal structures are unremarkable. IMPRESSION: Normal exam. Electronically Signed   By: 03/18/2019 M.D.   On: 02/11/2019 09:42  Procedures Procedures (including critical care time)  Medications Ordered in ED Medications - No data to display  ED Course  I have reviewed the triage vital signs and the nursing notes.  Pertinent labs & imaging results that were available during my care of the patient were reviewed by me and considered in my medical decision making (see chart for details).  Clinical Course as of Feb 11 1424  Fri Feb 11, 2019  3785 Patient presenting with chest pain, SOB, jaw pain and R arm pain intermittently for months now. No sx here in the ED. Currently already seeing [facility] for the R arm pain and the notes were reviewed by myself. They have done a thorough evaluation and have a thorough plan laid out for treatment of his R shoulder impingement vs cervical radicular pain. I will address the patient's intermittent chest pain, SOB and jaw pain. He is already taking xarelto for previous PE and reports not missing any doses. His pain sounds very atypical for ACS. Vitals normal here in ED and he has not had any cardiac symptoms today.   [KM]  1053 Patient has reassuring labs with negative troponin, normal BMP and a CBC at his baseline. Chest xray is also normal.  Patient has remained chest pain and shortness of breath free while here in the ED but he does have some T wave changes on his EKG from  previous EKG in January.  Discussed with Dr. Particia Nearing who agrees symptoms are very atypical of ACS.  Will repeat troponin.  Patient has a heart score of 3.   [KM]  1351 Patient was discussed with cardiology given EKG. I spoke with Dr. Cristal Deer who advised patient ok for d/c and can follow up closely with their office for further workup.   [KM]    Clinical Course User Index [KM] Jeral Pinch   MDM Rules/Calculators/A&P                      Based on review of vitals, medical screening exam, lab work and/or imaging, there does not appear to be an acute, emergent etiology for the patient's symptoms. Counseled pt on good return precautions and encouraged both PCP and ED follow-up as needed.  Prior to discharge, I also discussed incidental imaging findings with patient in detail and advised appropriate, recommended follow-up in detail.  Clinical Impression: 1. Atypical chest pain     Disposition: Discharge  Prior to providing a prescription for a controlled substance, I independently reviewed the patient's recent prescription history on the [place] Controlled Substance Reporting System. The patient had no recent or regular prescriptions and was deemed appropriate for a brief, less than 3 day prescription of narcotic for acute analgesia.  This note was prepared with assistance of Conservation officer, historic buildings. Occasional wrong-word or sound-a-like substitutions may have occurred due to the inherent limitations of voice recognition software.  Final Clinical Impression(s) / ED Diagnoses Final diagnoses:  Atypical chest pain    Rx / DC Orders ED Discharge Orders    None       Jeral Pinch 02/11/19 1426    Jacalyn Lefevre, MD 02/11/19 1553

## 2019-02-11 NOTE — ED Notes (Signed)
Patient verbalizes understanding of discharge instructions. Opportunity for questioning and answers were provided. Armband removed by staff, pt discharged from ED. Ambulated out to lobby  

## 2019-02-11 NOTE — ED Triage Notes (Signed)
C/o right arm and right shoulder pain , onset 12/21 states no change in pain , states he was referred to ortho and they refused to see him.

## 2019-02-18 ENCOUNTER — Ambulatory Visit (INDEPENDENT_AMBULATORY_CARE_PROVIDER_SITE_OTHER): Payer: Medicare Other | Admitting: Cardiology

## 2019-02-18 ENCOUNTER — Other Ambulatory Visit: Payer: Self-pay

## 2019-02-18 ENCOUNTER — Encounter: Payer: Self-pay | Admitting: Cardiology

## 2019-02-18 VITALS — BP 118/72 | HR 56 | Ht 70.0 in | Wt 180.8 lb

## 2019-02-18 DIAGNOSIS — Z7189 Other specified counseling: Secondary | ICD-10-CM

## 2019-02-18 DIAGNOSIS — Z86718 Personal history of other venous thrombosis and embolism: Secondary | ICD-10-CM | POA: Insufficient documentation

## 2019-02-18 DIAGNOSIS — M79604 Pain in right leg: Secondary | ICD-10-CM

## 2019-02-18 DIAGNOSIS — R6884 Jaw pain: Secondary | ICD-10-CM | POA: Diagnosis not present

## 2019-02-18 DIAGNOSIS — M79605 Pain in left leg: Secondary | ICD-10-CM | POA: Insufficient documentation

## 2019-02-18 DIAGNOSIS — R079 Chest pain, unspecified: Secondary | ICD-10-CM | POA: Insufficient documentation

## 2019-02-18 DIAGNOSIS — Z7689 Persons encountering health services in other specified circumstances: Secondary | ICD-10-CM

## 2019-02-18 DIAGNOSIS — Z86711 Personal history of pulmonary embolism: Secondary | ICD-10-CM | POA: Insufficient documentation

## 2019-02-18 NOTE — Patient Instructions (Signed)
Medication Instructions:  Your Physician recommend you continue on your current medication as directed.    *If you need a refill on your cardiac medications before your next appointment, please call your pharmacy*  Lab Work: None  Testing/Procedures: Your physician has requested that you have a lexiscan myoview. For further information please visit [email or web address] Please follow instruction sheet, as given. [address]   Follow-Up: At [facility], you and your health needs are our priority.  As part of our continuing mission to provide you with exceptional heart care, we have created designated Provider Care Teams.  These Care Teams include your primary Cardiologist (physician) and Advanced Practice Providers (APPs -  Physician Assistants and Nurse Practitioners) who all work together to provide you with the care you need, when you need it.  Your next appointment:   Based on test results   The format for your next appointment:   Either In Person or Virtual  Provider:   Jodelle Red, MD  You are scheduled for a Myocardial Perfusion Imaging Study on.  Please arrive 15 minutes prior to your appointment time for registration and insurance purposes.  The test will take approximately 3 to 4 hours to complete; you may bring reading material.  If someone comes with you to your appointment, they will need to remain in the main lobby due to limited space in the testing area. **If you are pregnant or breastfeeding, please notify the nuclear lab prior to your appointment**  How to prepare for your Myocardial Perfusion Test: . Do not eat or drink 3 hours prior to your test, except you may have water. . Do not consume products containing caffeine (regular or decaffeinated) 12 hours prior to your test. (ex: coffee, chocolate, sodas, tea). . Do bring a list of your current medications with you.  If not listed below, you may take your medications as normal. . Do wear  comfortable clothes (no dresses or overalls) and walking shoes, tennis shoes preferred (No heels or open toe shoes are allowed). . Do NOT wear cologne, perfume, aftershave, or lotions (deodorant is allowed). . If these instructions are not followed, your test will have to be rescheduled.  Please report to [address] for your test.  If you have questions or concerns about your appointment, you can call the Nuclear Lab at [phone].  If you cannot keep your appointment, please provide 24 hours notification to the Nuclear Lab, to avoid a possible $50 charge to your account.

## 2019-02-18 NOTE — Progress Notes (Signed)
Cardiology Office Note:    Date:  02/18/2019   ID:  Adrian Cantu, DOB September 02, 1965, MRN [id number]  PCP:  System, Provider Not In  Cardiologist:  Buford Dresser, MD  Referring MD: Dr. Isla Pence, ER  CC: new patient evaluation for atypical chest pain  History of Present Illness:    Adrian Cantu is a 54 y.o. Cantu with a hx of schizophrenia, anxiety, depression, unprovoked DVT/PE, chronic DJD and rotator cuff pain who is seen as a new consult at the request of Dr. Isla Pence for the evaluation and management of atypical chest pain.  ER note from 02/11/19 reviewed. Presented with right arm and right shoulder pain, has been following with orthopedics for this. Has exercises/physical therapy for this.   I also reviewed prior notes and workup in Care Everywhere. Of note, he was attempted to have treadmill exercise stress test at [facility] 08/2018, but this was nondiagnostic per report.  Today: The patient tells me that he has pain everywhere. He is followed closely for arm pain and is being treated with rotator cuff exercises. He also has longstanding bilateral leg pain, which has been previously evaluated.   He is more concerned that he has had intermittent stomach, chest, and jaw pain. Went to ER, ECG with questionable inferior Q/ST abnormalities. Troponins negative. No shortness of breath. He endorses this pain happening about 4 times/day, lasts about 30 seconds each time. No clear aggravating/alleviating factors. Not exertional, no clear associated symptoms.   Reports that he has had prior echocardiogram, told there was a "door" that was abnormal, back in 2005. Was done at a doctor's office in either [place] or [place], but office is now closed. Also had an attempt at a treadmill stress test at [facility] in 08/2018, but was unable to get to target, had to stop. I did see an echo done at [facility] in 2018, noted below.  Remains on xarelto for a history of DVT/PE in 2018. Has a PCP in  [place], but looking to establish care in [place] as it is difficult for him to travel back and forth to [facility].  ROS positive for longstanding leg pain, had ABIs in 2018 which were normal.  FH: some family members with heart issues, but those people also had issues with diabetes. No premature CAD.   Denies shortness of breath at rest or with normal exertion. No PND, orthopnea, LE edema or unexpected weight gain. No syncope or palpitations.  Past Medical History:  Diagnosis Date  . Degenerative cervical disc   . 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 BACK SURGERY    . CERVICAL DISC SURGERY      Current Medications: Current Outpatient Medications on File Prior to Visit  Medication Sig  . atorvastatin (LIPITOR) 20 MG tablet Take 20 mg by mouth daily.  Marland Kitchen FLUoxetine (PROZAC) 20 MG tablet Take 20 mg by mouth daily.  . risperiDONE (RISPERDAL) 2 MG tablet Take 2 mg by mouth at bedtime.  Marland Kitchen terazosin (HYTRIN) 2 MG capsule Take 2 mg by mouth at bedtime.  Alveda Reasons 10 MG TABS tablet Take 10 mg by mouth every morning.   No current facility-administered medications on file prior to visit.     Allergies:   Other and Shellfish allergy   Social History   Tobacco Use  . Smoking status: Never Smoker  . Smokeless tobacco: Never Used  Substance Use Topics  . Alcohol use:  No  . Drug use: No    Family History: some family members with heart issues, but those people also had issues with diabetes. No premature CAD.   ROS:   Please see the history of present illness.  Additional pertinent ROS: Constitutional: Negative for chills, fever, night sweats, unintentional weight loss  HENT: Negative for ear pain and hearing loss.   Eyes: Negative for loss of vision and eye pain.  Respiratory: Negative for cough, sputum, wheezing.   Cardiovascular: See HPI. Gastrointestinal: Negative for abdominal pain, melena, and hematochezia.   Genitourinary: Negative for dysuria and hematuria.  Musculoskeletal: Positive for diffuse myalgias.  Skin: Negative for itching and rash.  Neurological: Negative for focal weakness, focal sensory changes and loss of consciousness.  Endo/Heme/Allergies: Does bruise/bleed easily.     EKGs/Labs/Other Studies Reviewed:    The following studies were reviewed today: Echo 04/28/2016 ([facility]) SUMMARY The left ventricular size is normal. There is normal left ventricular wall thickness.  Left ventricular systolic function is normal. LV ejection fraction = 55-60%.  Left ventricular filling pattern is pseudonormal. No segmental wall motion abnormalities seen in the left ventricle The right ventricle is normal in size and function. The left atrium is mildly dilated. There is no significant valvular stenosis or regurgitation No pulmonary hypertension. There is no pericardial effusion. The IVC is normal in size with an inspiratory collapse of greater then 50%,  suggesting normal right atrial pressure. There is no comparison study available.   EKG:  EKG is personally reviewed.  The ekg ordered today demonstrates sinus bradycardia, no Q/ST changes noted on ER visit ECG  Recent Labs: 02/11/2019: BUN 12; Creatinine, Ser 1.23; Hemoglobin 15.1; Platelets 133; Potassium 4.1; Sodium 140  Recent Lipid Panel No results found for: CHOL, TRIG, HDL, CHOLHDL, VLDL, LDLCALC, LDLDIRECT  Physical Exam:    VS:  BP 118/72   Pulse (!) 56   Ht 5\' 10"  (1.778 m)   Wt 180 lb 12.8 oz (82 kg)   SpO2 98%   BMI 25.94 kg/m     Wt Readings from Last 3 Encounters:  02/18/19 180 lb 12.8 oz (82 kg)  02/11/19 185 lb (83.9 kg)  08/06/13 180 lb (81.6 kg)    GEN: Well nourished, well developed in no acute distress HEENT: Normal, moist mucous membranes NECK: No JVD CARDIAC: regular rhythm, normal S1 and S2, no rubs or gallops. No murmurs. VASCULAR: Radial and DP pulses 2+ bilaterally. No carotid bruits RESPIRATORY:   Clear to auscultation without rales, wheezing or rhonchi  ABDOMEN: Soft, non-tender, non-distended MUSCULOSKELETAL:  Ambulates independently SKIN: Warm and dry, no edema NEUROLOGIC:  Alert and oriented x 3. No focal neuro deficits noted. PSYCHIATRIC:  Normal affect    ASSESSMENT:    1. Chest pain, unspecified type   2. Encounter to establish care   3. Cardiac risk counseling   4. Counseling on health promotion and disease prevention   5. Jaw pain   6. Pain in both lower extremities    PLAN:    Chest and jaw pain: nonexertional, however he is very concerned that this is his heart. Had subtle ECG abnormalities (nonspecific) in the ER, not seen on ECG today.  -as he was unable to get to target on treadmill previously, will plan for lexiscan nuclear stress test to evaluate for ischemia -echo 2018 at [facility] unremarkable  Leg pain: strong pulses, prior ABI in 2018 unremarkable. Not consistent with PAD at this time. Monitor  History of DVT/PE: remains on xarelto. Has  been followed through [facility], wants to establish care in [place]. Referral sent. -remains on xarelto  Cardiac risk counseling and prevention recommendations: -recommend heart healthy/Mediterranean diet, with whole grains, fruits, vegetable, fish, lean meats, nuts, and olive oil. Limit salt. -recommend moderate walking, 3-5 times/week for 30-50 minutes each session. Aim for at least 150 minutes.week. Goal should be pace of 3 miles/hours, or walking 1.5 miles in 30 minutes -recommend avoidance of tobacco products. Avoid excess alcohol. -Additional risk factor control:  -Diabetes risk: A1c is not available  -Lipids: on atorvastatin 20 mg daily  -Blood pressure control: at goal, no history  -Weight: BMI 25 -ASCVD risk score: The 10-year ASCVD risk score Denman George DC Montez Hageman., et al., 2013) is: 5.9%   Values used to calculate the score:     Age: 38 years     Sex: Cantu     Is Non-Hispanic African American: Yes     Diabetic: No      Tobacco smoker: No     Systolic Blood Pressure: 118 mmHg     Is BP treated: No     HDL Cholesterol: 54 mg/dL     Total Cholesterol: 226 mg/dL    Plan for follow up: TBD based on test results. If imaging normal, can follow up PRN  Jodelle Red, MD, PhD [place]  [facility]    Medication Adjustments/Labs and Tests Ordered: Current medicines are reviewed at length with the patient today.  Concerns regarding medicines are outlined above.  Orders Placed This Encounter  Procedures  . Ambulatory referral to Internal Medicine  . MYOCARDIAL PERFUSION IMAGING  . EKG 12-Lead   No orders of the defined types were placed in this encounter.   Patient Instructions  Medication Instructions:  Your Physician recommend you continue on your current medication as directed.    *If you need a refill on your cardiac medications before your next appointment, please call your pharmacy*  Lab Work: None  Testing/Procedures: Your physician has requested that you have a lexiscan myoview. For further information please visit [email or web address] Please follow instruction sheet, as given. [address]   Follow-Up: At [facility], you and your health needs are our priority.  As part of our continuing mission to provide you with exceptional heart care, we have created designated Provider Care Teams.  These Care Teams include your primary Cardiologist (physician) and Advanced Practice Providers (APPs -  Physician Assistants and Nurse Practitioners) who all work together to provide you with the care you need, when you need it.  Your next appointment:   Based on test results   The format for your next appointment:   Either In Person or Virtual  Provider:   Jodelle Red, MD  You are scheduled for a Myocardial Perfusion Imaging Study on.  Please arrive 15 minutes prior to your appointment time for registration and insurance purposes.  The test will take  approximately 3 to 4 hours to complete; you may bring reading material.  If someone comes with you to your appointment, they will need to remain in the main lobby due to limited space in the testing area. **If you are pregnant or breastfeeding, please notify the nuclear lab prior to your appointment**  How to prepare for your Myocardial Perfusion Test: . Do not eat or drink 3 hours prior to your test, except you may have water. . Do not consume products containing caffeine (regular or decaffeinated) 12 hours prior to your test. (ex: coffee, chocolate, sodas, tea). Marland Kitchen  Do bring a list of your current medications with you.  If not listed below, you may take your medications as normal. . Do wear comfortable clothes (no dresses or overalls) and walking shoes, tennis shoes preferred (No heels or open toe shoes are allowed). . Do NOT wear cologne, perfume, aftershave, or lotions (deodorant is allowed). . If these instructions are not followed, your test will have to be rescheduled.  Please report to [address] for your test.  If you have questions or concerns about your appointment, you can call the Nuclear Lab at [phone].  If you cannot keep your appointment, please provide 24 hours notification to the Nuclear Lab, to avoid a possible $50 charge to your account.    Signed, Jodelle Red, MD PhD 02/18/2019 12:43 PM    [facility]

## 2019-02-21 ENCOUNTER — Encounter: Payer: Self-pay | Admitting: Cardiology

## 2019-02-23 ENCOUNTER — Telehealth (HOSPITAL_COMMUNITY): Payer: Self-pay

## 2019-02-23 NOTE — Telephone Encounter (Signed)
Encounter complete. 

## 2019-02-25 ENCOUNTER — Other Ambulatory Visit: Payer: Self-pay

## 2019-02-25 ENCOUNTER — Ambulatory Visit (HOSPITAL_COMMUNITY)
Admission: RE | Admit: 2019-02-25 | Discharge: 2019-02-25 | Disposition: A | Discharge status: Home / Self Care | Payer: Medicare Other | Source: Ambulatory Visit | Attending: Cardiology | Admitting: Cardiology

## 2019-02-25 DIAGNOSIS — R079 Chest pain, unspecified: Secondary | ICD-10-CM | POA: Diagnosis present

## 2019-02-25 LAB — MYOCARDIAL PERFUSION IMAGING
LV dias vol: 120 mL (ref 62–150)
LV sys vol: 59 mL
Peak HR: 95 {beats}/min
Rest HR: 57 {beats}/min
SDS: 1
SRS: 1
SSS: 2
TID: 1.25

## 2019-02-25 MED ORDER — TECHNETIUM TC 99M TETROFOSMIN IV KIT
10.8000 | PACK | Freq: Once | INTRAVENOUS | Status: AC | PRN
  Administered 2019-02-25: 10.8 via INTRAVENOUS
  Filled 2019-02-25: qty 11

## 2019-02-25 MED ORDER — TECHNETIUM TC 99M TETROFOSMIN IV KIT
30.2000 | PACK | Freq: Once | INTRAVENOUS | Status: AC | PRN
  Administered 2019-02-25: 30.2 via INTRAVENOUS
  Filled 2019-02-25: qty 31

## 2019-02-25 MED ORDER — REGADENOSON 0.4 MG/5ML IV SOLN
0.4000 mg | Freq: Once | INTRAVENOUS | Status: AC
  Administered 2019-02-25: 0.4 mg via INTRAVENOUS

## 2019-03-08 ENCOUNTER — Encounter: Payer: Self-pay | Admitting: Physical Therapy

## 2019-03-08 ENCOUNTER — Ambulatory Visit: Payer: Medicare Other | Attending: Sports Medicine | Admitting: Physical Therapy

## 2019-03-08 ENCOUNTER — Other Ambulatory Visit: Payer: Self-pay

## 2019-03-08 DIAGNOSIS — M25511 Pain in right shoulder: Secondary | ICD-10-CM | POA: Diagnosis present

## 2019-03-08 DIAGNOSIS — M6281 Muscle weakness (generalized): Secondary | ICD-10-CM | POA: Diagnosis present

## 2019-03-08 NOTE — Therapy (Signed)
[facility] [address] [place], [place], [address] Phone: [phone]   Fax:  [phone]  Physical Therapy Evaluation  Patient Details  Name: Adrian Cantu MRN: [id number] Date of Birth: 06-01-1965 Referring Provider (PT): Dr. Denzil Magnuson   Encounter Date: 03/08/2019  PT End of Session - 03/08/19 1131    Visit Number  1    Number of Visits  16    Date for PT Re-Evaluation  05/03/19    Authorization Type  [employer] MCR MCD    Progress Note Due on Visit  10    PT Start Time  1047    PT Stop Time  1135    PT Time Calculation (min)  48 min    Activity Tolerance  Patient tolerated treatment well    Behavior During Therapy  Flat affect       Past Medical History:  Diagnosis Date  . Degenerative cervical disc   . 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 BACK SURGERY    . CERVICAL DISC SURGERY      There were no vitals filed for this visit.   Subjective Assessment - 03/08/19 1054    Subjective  Pt began to have Rt shoulder pain in Dec 2020.  He moved to a new apt and had to sleep on the floor.  Rt shoulder pain, Rt arm pain (forearm) Rt feels cold and wet, numb at times.  He has occasional neck pain.    Pertinent History  Rt LE blood clot, C4 disc surgery, schizophrenia    Limitations  Lifting;House hold activities    Diagnostic tests  shoulder XR WNL    Patient Stated Goals  Pan relief    Currently in Pain?  Yes    Pain Score  6     Pain Location  Shoulder    Pain Orientation  Right    Pain Descriptors / Indicators  Aching;Sore    Pain Type  Chronic pain    Pain Radiating Towards  lower arm    Pain Onset  More than a month ago    Pain Frequency  Constant    Aggravating Factors   using it, reaching up and back    Pain Relieving Factors  laying on L side, laying flat with neck in neutral    Effect of Pain on Daily Activities  rides with  transportation, dressing, ADLs    Multiple Pain Sites  No         [facility] PT Assessment - 03/08/19 0001      Assessment   Medical Diagnosis  Rt shoulder impingement, cervical radiculopathy    Referring Provider (PT)  Dr. Denzil Magnuson    Onset Date/Surgical Date  --   Dec. 2020    Hand Dominance  Right    Prior Therapy  Yes, unclear as to reason       Precautions   Precautions  None    Precaution Comments  Xarelto for DVT , caution [place]       Restrictions   Weight Bearing Restrictions  No      Balance Screen   Has the patient fallen in the past 6 months  No      Home Environment   Living Environment  Private residence    Living Arrangements  Alone    Home Access  Elevator    Additional Comments  history of homelessness  Prior Function   Level of Independence  Independent    Vocation  Unemployed    Leisure  lacks social support , sedentary       Cognition   Overall Cognitive Status  Within Functional Limits for tasks assessed      Observation/Other Assessments   Focus on Therapeutic Outcomes (FOTO)   73%      Sensation   Light Touch  Impaired by gross assessment    Additional Comments  thumb and 1st digit numb in supine       Posture/Postural Control   Posture/Postural Control  Postural limitations    Postural Limitations  Rounded Shoulders;Forward head;Increased thoracic kyphosis    Posture Comments  Rt shoulder lower, humeral head IR, poor sitting posture , elevated shoulders       AROM   Right Shoulder Flexion  135 Degrees    Right Shoulder ABduction  130 Degrees   pain   Right Shoulder Internal Rotation  --   FR to mid back no pain    Right Shoulder External Rotation  --   FR to back of neck , thumb goes numb min pain    Cervical Flexion  60    Cervical Extension  20    Cervical - Right Side Bend  25    Cervical - Left Side Bend  25    Cervical - Right Rotation  55    Cervical - Left Rotation  60      PROM   Overall PROM Comments  Rt  shoulder pain with flexion to 150 deg, abduction 90 deg, no pain with ER/IR , cervical spine no pain with supine rotation, sidelbending       Strength   Right/Left Shoulder  --   L WNL    Right Shoulder Flexion  4-/5    Right Shoulder ABduction  3-/5    Right Shoulder Internal Rotation  4+/5    Right Shoulder External Rotation  4/5    Right Hand Grip (lbs)  73    Left Hand Grip (lbs)  52      Palpation   Spinal mobility  NT     Palpation comment  hypertonic upper traps bilateral , min soreness thorughout Rt periscapular mm, mid arm deltoid       Special Tests    Special Tests  Rotator Cuff Impingement    Rotator Cuff Impingment tests  Empty Can test;Painful Arc of Motion      Empty Can test   Findings  Positive    Side  Right      Painful Arc of Motion   Findings  Positive    Side  Right         Objective measurements completed on examination: See above findings.     PT Education - 03/08/19 1651    Education Details  PT/POC, HEP, posture, neck vs shoulder pain    Person(s) Educated  Patient    Methods  Explanation;Demonstration;Tactile cues;Verbal cues;Handout    Comprehension  Verbalized understanding;Need further instruction       PT Short Term Goals - 03/08/19 1844      PT SHORT TERM GOAL #1   Title  Pt will be I with HEP for Rt UE, posture    Time  4    Period  Weeks    Status  New    Target Date  04/05/19      PT SHORT TERM GOAL #2   Title  Pt will understand  his FOTO score and his ability to improve score, functional mobility    Baseline  73%    Time  4    Period  Weeks    Status  New    Target Date  03/29/19      PT SHORT TERM GOAL #3   Title  Pt will be able to use Rt UE for ADLs with min pain in Rt arm    Baseline  pain severe    Time  4    Period  Weeks    Status  New    Target Date  03/29/19      PT SHORT TERM GOAL #4   Title  Pt will report min occasional sensory disturbance in Rt lower arm    Baseline  frequent, moderate    Time  4     Period  Weeks    Status  New    Target Date  03/29/19        PT Long Term Goals - 03/08/19 1849      PT LONG TERM GOAL #1   Title  Pt will be able to improve FOTO score to less than 50% impaired overall    Baseline  73%    Time  8    Period  Weeks    Status  New    Target Date  05/03/19      PT LONG TERM GOAL #2   Title  Pt will be able to grab with handle on the bus with Rt UE with min increase in pain    Time  8    Period  Days    Status  New    Target Date  05/03/19      PT LONG TERM GOAL #3   Title  Pt will be able to sit with corrected posture with min cues.    Time  8    Period  Weeks    Status  New    Target Date  05/03/19      PT LONG TERM GOAL #4   Title  Pt will demo Rt shoulder strength to 4/5 or more for maximal use of Ue and postural support    Time  8    Period  Weeks    Status  New    Target Date  05/03/19      PT LONG TERM GOAL #5   Title  Pt will be I with final HEP for UE, posture and strength    Time  8    Period  Weeks    Status  New    Target Date  05/03/19             Plan - 03/08/19 1653    Clinical Impression Statement  Pt presents for mod complexity eval of Rt sided arm and shoulder pain. Differential diagnosis includes cervical radiculopathy, but symptoms do seem more shoulder impingement related.  He has poor sitting posture, neck and head position does contribute to pain. Supraspinatus was weak (3/5)  , pos empty can. He will benefit from PT for postural training, shoulder strength and ROM and modalties for pain.    Personal Factors and Comorbidities  Comorbidity 1;Behavior Pattern;Social Background    Comorbidities  neck surgery, lack of social support, sedentary, mental health    Examination-Activity Limitations  Reach Overhead;Bed Mobility;Sit;Sleep;Locomotion Level;Carry;Lift    Examination-Participation Restrictions  Community Activity    Stability/Clinical Decision Making  Evolving/Moderate complexity    Clinical  Decision Making  Moderate    Rehab Potential  Good    PT Frequency  2x / week    PT Duration  8 weeks    PT Treatment/Interventions  ADLs/Self Care Home Management;Electrical Stimulation;Cryotherapy;Iontophoresis 4mg /ml Dexamethasone;Moist Heat;Ultrasound;Functional mobility training;Therapeutic activities;Neuromuscular re-education;Manual techniques;Taping;Passive range of motion;Therapeutic exercise;Dry needling    PT Next Visit Plan  tape upper traps, review HEP, supine    PT Home Exercise Plan  cane flexion, chin tuck, scapular retraction and red band ER/IR    Consulted and Agree with Plan of Care  Patient       Patient will benefit from skilled therapeutic intervention in order to improve the following deficits and impairments:  Decreased range of motion, Decreased strength, Decreased mobility, Increased fascial restricitons, Impaired sensation, Impaired UE functional use, Pain, Postural dysfunction, Impaired flexibility  Visit Diagnosis: Acute pain of right shoulder  Muscle weakness (generalized)     Problem List Patient Active Problem List   Diagnosis Date Noted  . Chest pain 02/18/2019  . Pain in both lower extremities 02/18/2019  . History of DVT (deep vein thrombosis) 02/18/2019  . History of pulmonary embolism 02/18/2019    Shelisa Fern 03/08/2019, 6:58 PM  [facility] [address] [place], [place], [address] Phone: [phone]   Fax:  [phone]  Name: Jamaury Gumz MRN: [id number] Date of Birth: November 29, 1965   Raeford Razor, PT 03/08/19 6:59 PM Phone: [phone] Fax: [phone]

## 2019-03-08 NOTE — Patient Instructions (Signed)
Step 1  Step 2  Supine Chin Tuck reps: 10  sets: 2  hold: 5  daily: 3  weekly: 7 Setup  Begin lying on your back with your neck relaxed. Movement  Gently tuck your chin directly backward as if you are making a double chin. Hold, then relax and repeat. Tip  Make sure not to lift your head from the ground. Step 1  Step 2  Seated Scapular Retraction reps: 10  sets: 2  hold: 5  daily: 3  weekly: 7 Setup  Begin sitting in an upright position. Movement  Gently squeeze your shoulder blades together, relax, and then repeat. Tip  Make sure to maintain good posture during the exercise. Step 1  Step 2  Supine Shoulder Flexion Extension AAROM with Dowel reps: 10  sets: 2  hold: 5  daily: 2  weekly: 7 Setup  Begin lying on your back. Use a dowel to assist one arm into a vertical position. Movement  Use the dowel to move your arm up and down in a vertical direction. Tip  Make sure to allow your supportive arm to guide the motion with the dowel. Do not move through pain or arch your back. Step 1  Step 2  Standing Shoulder External Rotation with Resistance reps: 10  sets: 2  hold: 5  daily: 2  weekly: 7 Setup  Begin in a standing upright position holding both ends of a resistance band. Your elbows should be bent at 90 degrees with a towel roll tucked under each arm, and your thumbs pointing outward. Movement  Slowly rotate your arms out to the side, then bring them back to the starting position and repeat. Tip  Make sure to keep your hips and shoulders facing forward throughout the exercise. Think of squeezing your shoulder blades down and back as you pull your arms outward.

## 2019-03-11 ENCOUNTER — Other Ambulatory Visit: Payer: Self-pay

## 2019-03-11 ENCOUNTER — Encounter: Payer: Self-pay | Admitting: Internal Medicine

## 2019-03-11 ENCOUNTER — Ambulatory Visit: Payer: Medicare Other | Attending: Internal Medicine | Admitting: Internal Medicine

## 2019-03-11 VITALS — BP 118/63 | HR 67 | Temp 97.2°F | Resp 16 | Ht 70.0 in | Wt 196.2 lb

## 2019-03-11 DIAGNOSIS — D72819 Decreased white blood cell count, unspecified: Secondary | ICD-10-CM | POA: Diagnosis not present

## 2019-03-11 DIAGNOSIS — E782 Mixed hyperlipidemia: Secondary | ICD-10-CM | POA: Diagnosis not present

## 2019-03-11 DIAGNOSIS — D696 Thrombocytopenia, unspecified: Secondary | ICD-10-CM | POA: Diagnosis not present

## 2019-03-11 DIAGNOSIS — Z7689 Persons encountering health services in other specified circumstances: Secondary | ICD-10-CM

## 2019-03-11 DIAGNOSIS — K921 Melena: Secondary | ICD-10-CM | POA: Diagnosis not present

## 2019-03-11 DIAGNOSIS — Z8659 Personal history of other mental and behavioral disorders: Secondary | ICD-10-CM

## 2019-03-11 DIAGNOSIS — Z86711 Personal history of pulmonary embolism: Secondary | ICD-10-CM

## 2019-03-11 NOTE — Progress Notes (Signed)
Patient ID: Adrian Cantu, male    DOB: 04/20/1965  MRN: [id number]  CC: New Patient (Initial Visit)   Subjective: Adrian Cantu is a 54 y.o. male who presents for new patient visit His concerns today include:  Patient with history of HL, unprovoked DVT/BL PE 2018, schizophrenia, anxiety/depression,  Previous PCP was Adrian Cantu at [facility].  Decided to change because this is closer to him. He lives in [place].   He gives history of unprovoked DVT and bilateral PE in 2018.  On Xarelto with plan for life long.  He was seeing a hematologist at [facility].  HL:  Taking and tolerating Lipitor  On Prozac and Risperidone for depression and /schizophreniahallucinations.  Sees Adrian Cantu with [facility].  Has appt Q 3 mths.  Last televisit was in 12/2018.   Past hosp for hallucinations.  Last 2012 No recent hallucinations  Stools always black x 6 mths.  No gross blood in stools.  He states that his stools changed back to yellow color as long as he drinks water with lemon in it.  Denies any changes in bowel habits.  No recent changes in weight.  He had colonoscopy back in 2018 at [facility] which was normal.  On review of his medical records and note that he has intermittently low low WBC and PLt since at least 2019. No easy bruising or bleeding.  HM:  Had c-scope 2018 at [facility].  Nl per pt.    Past medical, social, family history reviewed and updated  Patient Active Problem List   Diagnosis Date Noted  . Chest pain 02/18/2019  . Pain in both lower extremities 02/18/2019  . History of DVT (deep vein thrombosis) 02/18/2019  . History of pulmonary embolism 02/18/2019     Current Outpatient Medications on File Prior to Visit  Medication Sig Dispense Refill  . atorvastatin (LIPITOR) 20 MG tablet Take 20 mg by mouth daily.    Marland Kitchen FLUoxetine (PROZAC) 20 MG tablet Take 20 mg by mouth daily.    . risperiDONE (RISPERDAL) 2 MG tablet Take 2 mg by mouth at bedtime.    Marland Kitchen  terazosin (HYTRIN) 2 MG capsule Take 2 mg by mouth at bedtime.    Adrian Cantu 10 MG TABS tablet Take 10 mg by mouth every morning.     No current facility-administered medications on file prior to visit.    Allergies  Allergen Reactions  . Other Rash    Apple juice  . Shellfish Allergy Rash    Social History   Socioeconomic History  . Marital status: Single    Spouse name: Not on file  . Number of children: 0  . Years of education: 12 grade.   . Highest education level: Not on file  Occupational History  . Occupation: disability    Comment: due to mental health  Tobacco Use  . Smoking status: Never Smoker  . Smokeless tobacco: Never Used  Substance and Sexual Activity  . Alcohol use: No  . Drug use: No  . Sexual activity: Yes    Birth control/protection: Condom  Other Topics Concern  . Not on file  Social History Narrative  . Not on file   Social Determinants of Health   Financial Resource Strain:   . Difficulty of Paying Living Expenses: Not on file  Food Insecurity:   . Worried About Programme researcher, broadcasting/film/video in the Last Year: Not on file  . Ran Out of Food in the Last Year:  Not on file  Transportation Needs:   . Lack of Transportation (Medical): Not on file  . Lack of Transportation (Non-Medical): Not on file  Physical Activity:   . Days of Exercise per Week: Not on file  . Minutes of Exercise per Session: Not on file  Stress:   . Feeling of Stress : Not on file  Social Connections:   . Frequency of Communication with Friends and Family: Not on file  . Frequency of Social Gatherings with Friends and Family: Not on file  . Attends Religious Services: Not on file  . Active Member of Clubs or Organizations: Not on file  . Attends Banker Meetings: Not on file  . Marital Status: Not on file  Intimate Partner Violence:   . Fear of Current or Ex-Partner: Not on file  . Emotionally Abused: Not on file  . Physically Abused: Not on file  . Sexually  Abused: Not on file    Family History  Problem Relation Age of Onset  . Hyperlipidemia Mother   . Hypertension Mother   . Diabetes Father   . Diabetes Sister   . Hyperlipidemia Sister   . Hypertension Sister   . Diabetes Brother     Past Surgical History:  Procedure Laterality Date  . BACK SURGERY    . CERVICAL DISC SURGERY      ROS: Review of Systems Negative except as stated above  PHYSICAL EXAM: BP 118/63   Pulse 67   Temp (!) 97.2 F (36.2 C)   Resp 16   Ht 5\' 10"  (1.778 m)   Wt 196 lb 3.2 oz (89 kg)   SpO2 96%   BMI 28.15 kg/m   Physical Exam General appearance - alert, well appearing, middle-aged African-American male and in no distress Mental status - normal mood, behavior, speech, dress, motor activity, and thought processes Nose - normal and patent, no erythema, discharge or polyps Mouth - mucous membranes moist, pharynx normal without lesions Neck - supple, no significant adenopathy Chest - clear to auscultation, no wheezes, rales or rhonchi, symmetric air entry Heart - normal rate, regular rhythm, normal S1, S2, no murmurs, rubs, clicks or gallops Abdomen - soft, nontender, nondistended, no masses or organomegaly Rectal -CMA Pollock present: No external hemorrhoids or rectal fissures noted.  No stools palpated in the vaginal vault.  Fecal Hemoccult was negative but again no stools really was palpated  extremities -no lower extremity edema CMP Latest Ref Rng & Units 02/11/2019 01/18/2019 06/29/2012  Glucose 70 - 99 mg/dL 98 97 07/01/2012)  BUN 6 - 20 mg/dL 12 10 18   Creatinine 0.61 - 1.24 mg/dL 235(T ) 7.32  Sodium 135 - 145 mmol/L 140 143 136  Potassium 3.5 - 5.1 mmol/L 4.1 4.7 3.9  Chloride 98 - 111 mmol/L 104 109 102  CO2 22 - 32 mmol/L 25 26 28   Calcium 8.9 - 10.3 mg/dL 9.6 9.7 8.7   Lipid Panel  No results found for: CHOL, TRIG, HDL, CHOLHDL, VLDL, LDLCALC, LDLDIRECT  CBC    Component Value Date/Time   WBC 3.2 (L) 02/11/2019 0931   RBC 5.95  (H) 02/11/2019 0931   HGB 15.1 02/11/2019 0931   HCT 47.7 02/11/2019 0931   PLT 133 (L) 02/11/2019 0931   MCV 80.2 02/11/2019 0931   MCH 25.4 (L) 02/11/2019 0931   MCHC 31.7 02/11/2019 0931   RDW 14.5 02/11/2019 0931    ASSESSMENT AND PLAN: 1. Establishing care with new doctor, encounter for  2. Mixed  hyperlipidemia I was able to look on care everywhere and see that he had a recent lipid profile that was normal.  Continue Lipitor - Hepatic Function Panel  3. Leukopenia, unspecified type 4. Thrombocytopenia ([place]) Looks chronic but will send him to hematology for evaluation - Ambulatory referral to Hematology   5. History of pulmonary embolism Patient on lifelong Xarelto  6. Black stools Patient given fecal Hemoccult card to take home.  I have showed him how to use it.  He will return a sample to [place].  7. History of schizophrenia Plugged into mental health services already.    Patient was given the opportunity to ask questions.  Patient verbalized understanding of the plan and was able to repeat key elements of the plan.   Orders Placed This Encounter  Procedures  . Hepatic Function Panel  . Ambulatory referral to Hematology     Requested Prescriptions    No prescriptions requested or ordered in this encounter    Return in about 3 months (around 06/11/2019).  Karle Plumber, MD, FACP

## 2019-03-12 LAB — HEPATIC FUNCTION PANEL
ALT: 80 IU/L — ABNORMAL HIGH (ref 0–44)
AST: 51 IU/L — ABNORMAL HIGH (ref 0–40)
Albumin: 4.5 g/dL (ref 3.8–4.9)
Alkaline Phosphatase: 88 IU/L (ref 39–117)
Bilirubin Total: 0.5 mg/dL (ref 0.0–1.2)
Bilirubin, Direct: 0.13 mg/dL (ref 0.00–0.40)
Total Protein: 7.1 g/dL (ref 6.0–8.5)

## 2019-03-13 ENCOUNTER — Other Ambulatory Visit: Payer: Self-pay | Admitting: Internal Medicine

## 2019-03-13 DIAGNOSIS — R7989 Other specified abnormal findings of blood chemistry: Secondary | ICD-10-CM

## 2019-03-13 DIAGNOSIS — R945 Abnormal results of liver function studies: Secondary | ICD-10-CM

## 2019-03-13 NOTE — Progress Notes (Signed)
Let patient know that 2 of his liver enzymes are elevated.  This may be due to the Lipitor.  I recommend holding the Lipitor for 2 weeks and then restarting at a lower dose of 10 mg daily.  This means he should take 1/2 tablet of the 20 mg tab.  After being on the lower dose for 4-6 wks, he should return to the lab for repeat liver function tests.

## 2019-03-15 ENCOUNTER — Other Ambulatory Visit: Payer: Self-pay

## 2019-03-15 ENCOUNTER — Ambulatory Visit: Payer: Medicare Other

## 2019-03-15 DIAGNOSIS — M6281 Muscle weakness (generalized): Secondary | ICD-10-CM

## 2019-03-15 DIAGNOSIS — M25511 Pain in right shoulder: Secondary | ICD-10-CM | POA: Diagnosis not present

## 2019-03-15 NOTE — Therapy (Signed)
[facility] [address] [place], [place], [address] Phone: [phone]   Fax:  [phone]  Physical Therapy Treatment  Patient Details  Name: Adrian Cantu MRN: [id number] Date of Birth: March 15, 1965 Referring Provider (PT): Dr. Denzil Magnuson   Encounter Date: 03/15/2019  PT End of Session - 03/15/19 2246    Visit Number  2    Number of Visits  16    Date for PT Re-Evaluation  05/03/19    Authorization Type  UHC MCR MCD    Progress Note Due on Visit  10    PT Start Time  1445    PT Stop Time  1530    PT Time Calculation (min)  45 min    Activity Tolerance  Patient tolerated treatment well    Behavior During Therapy  [facility] for tasks assessed/performed       Past Medical History:  Diagnosis Date  . Degenerative cervical disc   . 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 BACK SURGERY    . CERVICAL DISC SURGERY      There were no vitals filed for this visit.  Subjective Assessment - 03/15/19 1455    Subjective  pt reports he is having a good day today with R sh at 3/10.                       [facility] Adult PT Treatment/Exercise - 03/15/19 0001      Exercises   Exercises  Shoulder;Neck      Neck Exercises: Standing   Neck Retraction  10 reps      Shoulder Exercises: Standing   Extension  Strengthening;10 reps;Theraband   Green theraband; 2 sets   Retraction  AROM;10 reps    Other Standing Exercises  R sh flex with cane assist; 10x pressure on/off; ;5 reps 10 sec hold    Other Standing Exercises  R sh ext with cane assist,    10x pressure on/off; 5x 10 sec hold     Shoulder Exercises: ROM/Strengthening   UBE (Upper Arm Bike)  5 mins    Other ROM/Strengthening Exercises  Sh ext; green Tband; 10x2             PT Education - 03/15/19 2244    Education Details  HEP; Positioning and support recommendations for the R UE to  help manage pain for improved sleeping.    Person(s) Educated  Patient    Methods  Explanation;Demonstration;Tactile cues;Verbal cues    Comprehension  Verbalized understanding;Returned demonstration;Verbal cues required;Tactile cues required       PT Short Term Goals - 03/08/19 1844      PT SHORT TERM GOAL #1   Title  Pt will be I with HEP for Rt UE, posture    Time  4    Period  Weeks    Status  New    Target Date  04/05/19      PT SHORT TERM GOAL #2   Title  Pt will understand his FOTO score and his ability to improve score, functional mobility    Baseline  73%    Time  4    Period  Weeks    Status  New    Target Date  03/29/19      PT SHORT TERM GOAL #3   Title  Pt will be able to use Rt UE for ADLs  with min pain in Rt arm    Baseline  pain severe    Time  4    Period  Weeks    Status  New    Target Date  03/29/19      PT SHORT TERM GOAL #4   Title  Pt will report min occasional sensory disturbance in Rt lower arm    Baseline  frequent, moderate    Time  4    Period  Weeks    Status  New    Target Date  03/29/19        PT Long Term Goals - 03/08/19 1849      PT LONG TERM GOAL #1   Title  Pt will be able to improve FOTO score to less than 50% impaired overall    Baseline  73%    Time  8    Period  Weeks    Status  New    Target Date  05/03/19      PT LONG TERM GOAL #2   Title  Pt will be able to grab with handle on the bus with Rt UE with min increase in pain    Time  8    Period  Days    Status  New    Target Date  05/03/19      PT LONG TERM GOAL #3   Title  Pt will be able to sit with corrected posture with min cues.    Time  8    Period  Weeks    Status  New    Target Date  05/03/19      PT LONG TERM GOAL #4   Title  Pt will demo Rt shoulder strength to 4/5 or more for maximal use of Ue and postural support    Time  8    Period  Weeks    Status  New    Target Date  05/03/19      PT LONG TERM GOAL #5   Title  Pt will be I with final HEP  for UE, posture and strength    Time  8    Period  Weeks    Status  New    Target Date  05/03/19            Plan - 03/15/19 2248    Clinical Impression Statement  Pt returned demonstration re: HEP to address posture and R sh ROM, strength and pain. pt 's R sh pain is improved today vs. the initial eval.    PT Treatment/Interventions  ADLs/Self Care Home Management;Electrical Stimulation;Cryotherapy;Iontophoresis 4mg /ml Dexamethasone;Moist Heat;Ultrasound;Functional mobility training;Therapeutic activities;Neuromuscular re-education;Manual techniques;Taping;Passive range of motion;Therapeutic exercise;Dry needling    PT Next Visit Plan  Assess and review HEP. Progress ther ex for ROM and strength as indicated. Utilize modalities as indicated.    PT Home Exercise Plan  [id number]       Patient will benefit from skilled therapeutic intervention in order to improve the following deficits and impairments:     Visit Diagnosis: Acute pain of right shoulder  Muscle weakness (generalized)     Problem List Patient Active Problem List   Diagnosis Date Noted  . Leukopenia 03/11/2019  . Thrombocytopenia (HCC) 03/11/2019  . Mixed hyperlipidemia 03/11/2019  . Chest pain 02/18/2019  . Pain in both lower extremities 02/18/2019  . History of DVT (deep vein thrombosis) 02/18/2019  . History of pulmonary embolism 02/18/2019    [facility] [address]  [place], [place], [address] Phone: [phone]   Fax:  [phone]  Name: Raffi Milstein MRN: [id number] Date of Birth: [birth year]   Gar Ponto MS, PT 03/15/19 11:20 PM

## 2019-03-17 ENCOUNTER — Ambulatory Visit: Payer: Medicare Other | Admitting: Physical Therapy

## 2019-03-17 ENCOUNTER — Other Ambulatory Visit: Payer: Self-pay

## 2019-03-17 ENCOUNTER — Encounter: Payer: Self-pay | Admitting: Physical Therapy

## 2019-03-17 DIAGNOSIS — M25511 Pain in right shoulder: Secondary | ICD-10-CM | POA: Diagnosis not present

## 2019-03-17 DIAGNOSIS — M6281 Muscle weakness (generalized): Secondary | ICD-10-CM

## 2019-03-17 NOTE — Therapy (Signed)
[facility] [address] [place], [place], [address] Phone: [phone]   Fax:  [phone]  Physical Therapy Treatment  Patient Details  Name: Adrian Cantu MRN: [id number] Date of Birth: 1965/04/23 Referring Provider (PT): Dr. Denzil Magnuson   Encounter Date: 03/17/2019  PT End of Session - 03/17/19 1125    Visit Number  3    Number of Visits  16    Date for PT Re-Evaluation  05/03/19    Authorization Type  UHC MCR MCD    PT Start Time  1112    PT Stop Time  1200    PT Time Calculation (min)  48 min       Past Medical History:  Diagnosis Date  . Degenerative cervical disc   . 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 BACK SURGERY    . CERVICAL DISC SURGERY      There were no vitals filed for this visit.  Subjective Assessment - 03/17/19 1114    Subjective  No pain and moving it better.         [facility] PT Assessment - 03/17/19 0001      AROM   Right Shoulder Flexion  145 Degrees                   [facility] Adult PT Treatment/Exercise - 03/17/19 0001      Neck Exercises: Standing   Neck Retraction  10 reps      Shoulder Exercises: Supine   Other Supine Exercises  Supine chest press and pullovers , ER      Shoulder Exercises: Sidelying   External Rotation  20 reps    ABduction  10 reps      Shoulder Exercises: Standing   External Rotation  20 reps    Theraband Level (Shoulder External Rotation)  Level 3 (Green)    Extension  Strengthening;10 reps;Theraband   Green theraband; 2 sets   Retraction  AROM;10 reps    Other Standing Exercises  Rt shoulder cane flexion and abduction      Shoulder Exercises: ROM/Strengthening   Other ROM/Strengthening Exercises  Sh ext; green Tband; 10x2      Modalities   Modalities  Cryotherapy      Cryotherapy   Number Minutes Cryotherapy  10 Minutes    Cryotherapy Location  Shoulder    Type of Cryotherapy  Ice pack      Manual Therapy   Manual therapy comments  Passive flexion               PT Short Term Goals - 03/08/19 1844      PT SHORT TERM GOAL #1   Title  Pt will be I with HEP for Rt UE, posture    Time  4    Period  Weeks    Status  New    Target Date  04/05/19      PT SHORT TERM GOAL #2   Title  Pt will understand his FOTO score and his ability to improve score, functional mobility    Baseline  73%    Time  4    Period  Weeks    Status  New    Target Date  03/29/19      PT SHORT TERM GOAL #3   Title  Pt will be able to use Rt UE for ADLs with min pain in Rt arm  Baseline  pain severe    Time  4    Period  Weeks    Status  New    Target Date  03/29/19      PT SHORT TERM GOAL #4   Title  Pt will report min occasional sensory disturbance in Rt lower arm    Baseline  frequent, moderate    Time  4    Period  Weeks    Status  New    Target Date  03/29/19        PT Long Term Goals - 03/08/19 1849      PT LONG TERM GOAL #1   Title  Pt will be able to improve FOTO score to less than 50% impaired overall    Baseline  73%    Time  8    Period  Weeks    Status  New    Target Date  05/03/19      PT LONG TERM GOAL #2   Title  Pt will be able to grab with handle on the bus with Rt UE with min increase in pain    Time  8    Period  Days    Status  New    Target Date  05/03/19      PT LONG TERM GOAL #3   Title  Pt will be able to sit with corrected posture with min cues.    Time  8    Period  Weeks    Status  New    Target Date  05/03/19      PT LONG TERM GOAL #4   Title  Pt will demo Rt shoulder strength to 4/5 or more for maximal use of Ue and postural support    Time  8    Period  Weeks    Status  New    Target Date  05/03/19      PT LONG TERM GOAL #5   Title  Pt will be I with final HEP for UE, posture and strength    Time  8    Period  Weeks    Status  New    Target Date  05/03/19            Plan -  03/17/19 1211    Clinical Impression Statement  Pt arrives reporting improvement in pain and improved reaching. After pulleys ROM measurements taken and pt could not abduct greater than 75 actively. Used standing cane exercise to assist abduction and this was tolerated well with improved ROM. Continued with AAROM and strenthening. Pt notes that in supine he can move farther into flexion when his head is turned to the right side. Trial if cryotherapy to right shoulder. Pt reported pain unchanged afterward.    PT Next Visit Plan  Assess and review HEP. Progress ther ex for ROM and strength as indicated. Utilize modalities as indicated.    PT Home Exercise Plan  [id number]       Patient will benefit from skilled therapeutic intervention in order to improve the following deficits and impairments:  Decreased range of motion, Decreased strength, Decreased mobility, Increased fascial restricitons, Impaired sensation, Impaired UE functional use, Pain, Postural dysfunction, Impaired flexibility  Visit Diagnosis: Acute pain of right shoulder  Muscle weakness (generalized)     Problem List Patient Active Problem List   Diagnosis Date Noted  . Leukopenia 03/11/2019  . Thrombocytopenia (HCC) 03/11/2019  . Mixed hyperlipidemia 03/11/2019  . Chest pain 02/18/2019  .  Pain in both lower extremities 02/18/2019  . History of DVT (deep vein thrombosis) 02/18/2019  . History of pulmonary embolism 02/18/2019    Dorene Ar, [place] 03/17/2019, 12:15 PM  [facility] [address] [place], [place], [address] Phone: [phone]   Fax:  [phone]  Name: Orlen Leedy MRN: [id number] Date of Birth: 27-Sep-1965

## 2019-03-18 ENCOUNTER — Telehealth: Payer: Self-pay | Admitting: Family

## 2019-03-18 ENCOUNTER — Other Ambulatory Visit: Payer: Self-pay | Admitting: Family

## 2019-03-18 NOTE — Telephone Encounter (Signed)
New patient letter/calendar was mailed

## 2019-03-22 ENCOUNTER — Ambulatory Visit: Payer: Medicare Other | Admitting: Physical Therapy

## 2019-03-22 ENCOUNTER — Encounter: Payer: Self-pay | Admitting: Physical Therapy

## 2019-03-22 ENCOUNTER — Other Ambulatory Visit: Payer: Self-pay

## 2019-03-22 DIAGNOSIS — M6281 Muscle weakness (generalized): Secondary | ICD-10-CM

## 2019-03-22 DIAGNOSIS — M25511 Pain in right shoulder: Secondary | ICD-10-CM

## 2019-03-22 NOTE — Therapy (Signed)
[facility] Outpatient Rehabilitation [facility] [address] [place], [place], [address] Phone: [phone]   Fax:  [phone]  Physical Therapy Treatment  Patient Details  Name: Adrian Cantu MRN: [id number] Date of Birth: 01-24-1965 Referring Provider (PT): Dr. Denzil Magnuson   Encounter Date: 03/22/2019  PT End of Session - 03/22/19 1319    Visit Number  4    Number of Visits  16    Date for PT Re-Evaluation  05/03/19    Authorization Type  UHC MCR MCD    PT Start Time  1315    PT Stop Time  1407    PT Time Calculation (min)  52 min       Past Medical History:  Diagnosis Date  . Degenerative cervical disc   . 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 BACK SURGERY    . CERVICAL DISC SURGERY      There were no vitals filed for this visit.  Subjective Assessment - 03/22/19 1318    Subjective  No pain at rest. shoulder starts hurtng after I start moving around.    Currently in Pain?  No/denies         [facility] PT Assessment - 03/22/19 0001      AROM   Right Shoulder Flexion  --   WFL   Right Shoulder ABduction  --   [facility]   Right Shoulder Internal Rotation  --   [facility]   Right Shoulder External Rotation  --   Reach T2     Strength   Right Shoulder Flexion  4+/5    Right Shoulder ABduction  4/5    Right Shoulder Internal Rotation  5/5    Right Shoulder External Rotation  4+/5                   [facility] Adult PT Treatment/Exercise - 03/22/19 0001      Shoulder Exercises: Supine   Other Supine Exercises  Supine chest press and pullovers      Shoulder Exercises: Sidelying   External Rotation  20 reps    External Rotation Weight (lbs)  2    ABduction  15 reps    ABduction Weight (lbs)  2      Shoulder Exercises: Standing   External Rotation  20 reps    Theraband Level (Shoulder External Rotation)  Level 3 (Green)    Internal Rotation  20 reps    Theraband  Level (Shoulder Internal Rotation)  Level 3 (Green)    Flexion Limitations  green band punch x 20     Extension  Strengthening;10 reps;Theraband   Green theraband; 2 sets   Row  20 reps    Theraband Level (Shoulder Row)  Level 3 (Green)    Other Standing Exercises  forward raise 2# x 20     Other Standing Exercises  lateral raise 2# x 20       Shoulder Exercises: Pulleys   Flexion  2 minutes             PT Education - 03/22/19 1402    Education Details  HEP    Person(s) Educated  Patient    Methods  Explanation;Handout    Comprehension  Verbalized understanding       PT Short Term Goals - 03/22/19 1336      PT SHORT TERM GOAL #1   Title  Pt will be I with  HEP for Rt UE, posture    Time  4    Period  Weeks    Status  Achieved      PT SHORT TERM GOAL #2   Title  Pt will understand his FOTO score and his ability to improve score, functional mobility    Baseline  73%    Time  4    Period  Weeks    Status  On-going      PT SHORT TERM GOAL #3   Title  Pt will be able to use Rt UE for ADLs with min pain in Rt arm    Baseline  pain averaging 1-2/10    Time  4    Period  Weeks    Status  Achieved      PT SHORT TERM GOAL #4   Title  Pt will report min occasional sensory disturbance in Rt lower arm    Baseline  10 days without sendory disturbance -03/22/19    Time  4    Period  Weeks    Status  Achieved        PT Long Term Goals - 03/08/19 1849      PT LONG TERM GOAL #1   Title  Pt will be able to improve FOTO score to less than 50% impaired overall    Baseline  73%    Time  8    Period  Weeks    Status  New    Target Date  05/03/19      PT LONG TERM GOAL #2   Title  Pt will be able to grab with handle on the bus with Rt UE with min increase in pain    Time  8    Period  Days    Status  New    Target Date  05/03/19      PT LONG TERM GOAL #3   Title  Pt will be able to sit with corrected posture with min cues.    Time  8    Period  Weeks    Status   New    Target Date  05/03/19      PT LONG TERM GOAL #4   Title  Pt will demo Rt shoulder strength to 4/5 or more for maximal use of Ue and postural support    Time  8    Period  Weeks    Status  New    Target Date  05/03/19      PT LONG TERM GOAL #5   Title  Pt will be I with final HEP for UE, posture and strength    Time  8    Period  Weeks    Status  New    Target Date  05/03/19            Plan - 03/22/19 1406    Clinical Impression Statement  Pt arrives with increased ROM and strength. Able to updated HEP with green band strengthening. Some end range popping and locking when performing supine flexion, and seems to be related to neck position.    PT Next Visit Plan  Assess and review HEP. Progress ther ex for ROM and strength as indicated. Utilize modalities as indicated.    PT [place], [id number] ([place])       Patient will benefit from skilled therapeutic intervention in order to improve the following deficits and impairments:  Decreased range of motion, Decreased strength, Decreased mobility, Increased  fascial restricitons, Impaired sensation, Impaired UE functional use, Pain, Postural dysfunction, Impaired flexibility  Visit Diagnosis: Acute pain of right shoulder  Muscle weakness (generalized)     Problem List Patient Active Problem List   Diagnosis Date Noted  . Leukopenia 03/11/2019  . Thrombocytopenia (HCC) 03/11/2019  . Mixed hyperlipidemia 03/11/2019  . Chest pain 02/18/2019  . Pain in both lower extremities 02/18/2019  . History of DVT (deep vein thrombosis) 02/18/2019  . History of pulmonary embolism 02/18/2019    Sherrie Mustache, [place] 03/22/2019, 2:08 PM  [facility] [address] [place], [place], [address] Phone: [phone]   Fax:  [phone]  Name: Adrian Cantu MRN: [id number] Date of Birth: 1966/01/02

## 2019-03-22 NOTE — Patient Instructions (Signed)
Access Code: [email or web address] URL: [email or web address] Date: 03/22/2019 Prepared by: Jannette Spanner  Exercises Shoulder External Rotation with Anchored Resistance - 2 x daily - 7 x weekly - 2 sets - 10 reps Shoulder Internal Rotation with Resistance - 2 x daily - 7 x weekly - 2 sets - 10 reps Standing Serratus Punch with Resistance - 2 x daily - 7 x weekly - 2 sets - 10 reps

## 2019-03-24 ENCOUNTER — Other Ambulatory Visit: Payer: Self-pay

## 2019-03-24 ENCOUNTER — Encounter: Payer: Self-pay | Admitting: Physical Therapy

## 2019-03-24 ENCOUNTER — Ambulatory Visit: Payer: Medicare Other | Admitting: Physical Therapy

## 2019-03-24 DIAGNOSIS — M25511 Pain in right shoulder: Secondary | ICD-10-CM | POA: Diagnosis not present

## 2019-03-24 DIAGNOSIS — M6281 Muscle weakness (generalized): Secondary | ICD-10-CM

## 2019-03-24 NOTE — Therapy (Signed)
[facility] Outpatient Rehabilitation [facility] [address] [place], [place], [address] Phone: [phone]   Fax:  [phone]  Physical Therapy Treatment  Patient Details  Name: Adrian Cantu MRN: [id number] Date of Birth: 1965-10-02 Referring Provider (PT): Dr. Denzil Magnuson   Encounter Date: 03/24/2019  PT End of Session - 03/24/19 1142    Visit Number  5    Number of Visits  16    Date for PT Re-Evaluation  05/03/19    Authorization Type  UHC MCR MCD    PT Start Time  1100    PT Stop Time  1140    PT Time Calculation (min)  40 min       Past Medical History:  Diagnosis Date  . Degenerative cervical disc   . 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 BACK SURGERY    . CERVICAL DISC SURGERY      There were no vitals filed for this visit.                    [facility] Adult PT Treatment/Exercise - 03/24/19 0001      Shoulder Exercises: Supine   Other Supine Exercises  supine flexion 45 to 125 ROM with 2# x 20 , circles at chest level 2#     Other Supine Exercises  Supine chest press and pullovers   3# on dowel      Shoulder Exercises: Sidelying   External Rotation  20 reps    External Rotation Weight (lbs)  2    ABduction  15 reps    ABduction Weight (lbs)  2      Shoulder Exercises: Standing   External Rotation  20 reps    Theraband Level (Shoulder External Rotation)  Level 3 (Green)    Internal Rotation  20 reps    Theraband Level (Shoulder Internal Rotation)  Level 3 (Green)    Flexion Limitations  green band punch x 20     Extension  Strengthening;10 reps;Theraband   blue theraband; 2 sets   Row  20 reps    Theraband Level (Shoulder Row)  Level 4 (Blue)    Other Standing Exercises  forward raise 2# x 20 x 2    Other Standing Exercises  lateral raise 2# x 20 x 2      Shoulder Exercises: Pulleys   Flexion  2 minutes      Shoulder Exercises:  ROM/Strengthening   UBE (Upper Arm Bike)  5 mins L3 (half forward/ half retro )       Manual Therapy   Manual therapy comments  Passive flexion, A/P mobs and inferior glides                PT Short Term Goals - 03/22/19 1336      PT SHORT TERM GOAL #1   Title  Pt will be I with HEP for Rt UE, posture    Time  4    Period  Weeks    Status  Achieved      PT SHORT TERM GOAL #2   Title  Pt will understand his FOTO score and his ability to improve score, functional mobility    Baseline  73%    Time  4    Period  Weeks    Status  On-going      PT SHORT TERM GOAL #3   Title  Pt will  be able to use Rt UE for ADLs with min pain in Rt arm    Baseline  pain averaging 1-2/10    Time  4    Period  Weeks    Status  Achieved      PT SHORT TERM GOAL #4   Title  Pt will report min occasional sensory disturbance in Rt lower arm    Baseline  10 days without sendory disturbance -03/22/19    Time  4    Period  Weeks    Status  Achieved        PT Long Term Goals - 03/08/19 1849      PT LONG TERM GOAL #1   Title  Pt will be able to improve FOTO score to less than 50% impaired overall    Baseline  73%    Time  8    Period  Weeks    Status  New    Target Date  05/03/19      PT LONG TERM GOAL #2   Title  Pt will be able to grab with handle on the bus with Rt UE with min increase in pain    Time  8    Period  Days    Status  New    Target Date  05/03/19      PT LONG TERM GOAL #3   Title  Pt will be able to sit with corrected posture with min cues.    Time  8    Period  Weeks    Status  New    Target Date  05/03/19      PT LONG TERM GOAL #4   Title  Pt will demo Rt shoulder strength to 4/5 or more for maximal use of Ue and postural support    Time  8    Period  Weeks    Status  New    Target Date  05/03/19      PT LONG TERM GOAL #5   Title  Pt will be I with final HEP for UE, posture and strength    Time  8    Period  Weeks    Status  New    Target Date   05/03/19            Plan - 03/24/19 1159    Clinical Impression Statement  Pt arrives reporting no pain. He did not do his theraband HEP. He did receive his COVID vaccine yesterday and feels sore in his left arm. Continued with strengthening focus and flxion mobs/ROM. He intermittently has stiffness with shoulder flexion, was improved after shoulder mobs today. Fatigues with abduction and ER strengthening. Progressing with strength and goals.    PT Next Visit Plan  Assess and review HEP. Progress ther ex for ROM and strength as indicated. Utilize modalities as indicated.    PT Home Exercise Plan  [id number], [email or web address] ([place])       Patient will benefit from skilled therapeutic intervention in order to improve the following deficits and impairments:  Decreased range of motion, Decreased strength, Decreased mobility, Increased fascial restricitons, Impaired sensation, Impaired UE functional use, Pain, Postural dysfunction, Impaired flexibility  Visit Diagnosis: Acute pain of right shoulder  Muscle weakness (generalized)     Problem List Patient Active Problem List   Diagnosis Date Noted  . Leukopenia 03/11/2019  . Thrombocytopenia (HCC) 03/11/2019  . Mixed hyperlipidemia 03/11/2019  . Chest pain 02/18/2019  . Pain in both lower extremities 02/18/2019  .  History of DVT (deep vein thrombosis) 02/18/2019  . History of pulmonary embolism 02/18/2019    Dorene Ar, [place] 03/24/2019, 12:02 PM  [facility] [address] [place], [place], [address] Phone: [phone]   Fax:  [phone]  Name: Adrian Cantu MRN: [id number] Date of Birth: 01-Jan-1966

## 2019-03-29 ENCOUNTER — Ambulatory Visit: Payer: Medicare Other | Admitting: Physical Therapy

## 2019-03-29 ENCOUNTER — Other Ambulatory Visit: Payer: Self-pay | Admitting: Family

## 2019-03-29 DIAGNOSIS — D696 Thrombocytopenia, unspecified: Secondary | ICD-10-CM

## 2019-03-30 ENCOUNTER — Inpatient Hospital Stay (HOSPITAL_BASED_OUTPATIENT_CLINIC_OR_DEPARTMENT_OTHER): Payer: Medicare Other | Admitting: Family

## 2019-03-30 ENCOUNTER — Inpatient Hospital Stay: Payer: Medicare Other | Attending: Family

## 2019-03-30 ENCOUNTER — Other Ambulatory Visit: Payer: Self-pay

## 2019-03-30 VITALS — BP 127/73 | HR 67 | Temp 97.1°F | Wt 189.0 lb

## 2019-03-30 DIAGNOSIS — R7989 Other specified abnormal findings of blood chemistry: Secondary | ICD-10-CM

## 2019-03-30 DIAGNOSIS — D696 Thrombocytopenia, unspecified: Secondary | ICD-10-CM | POA: Diagnosis not present

## 2019-03-30 DIAGNOSIS — R945 Abnormal results of liver function studies: Secondary | ICD-10-CM

## 2019-03-30 DIAGNOSIS — Z79899 Other long term (current) drug therapy: Secondary | ICD-10-CM | POA: Insufficient documentation

## 2019-03-30 DIAGNOSIS — Z7901 Long term (current) use of anticoagulants: Secondary | ICD-10-CM | POA: Insufficient documentation

## 2019-03-30 DIAGNOSIS — Z86718 Personal history of other venous thrombosis and embolism: Secondary | ICD-10-CM | POA: Diagnosis not present

## 2019-03-30 DIAGNOSIS — D72819 Decreased white blood cell count, unspecified: Secondary | ICD-10-CM | POA: Diagnosis present

## 2019-03-30 DIAGNOSIS — D708 Other neutropenia: Secondary | ICD-10-CM | POA: Diagnosis not present

## 2019-03-30 LAB — LACTATE DEHYDROGENASE: LDH: 184 U/L (ref 98–192)

## 2019-03-30 LAB — CMP (CANCER CENTER ONLY)
ALT: 36 U/L (ref 0–44)
AST: 23 U/L (ref 15–41)
Albumin: 4.3 g/dL (ref 3.5–5.0)
Alkaline Phosphatase: 64 U/L (ref 38–126)
Anion gap: 7 (ref 5–15)
BUN: 19 mg/dL (ref 6–20)
CO2: 26 mmol/L (ref 22–32)
Calcium: 9.3 mg/dL (ref 8.9–10.3)
Chloride: 111 mmol/L (ref 98–111)
Creatinine: 1.22 mg/dL (ref 0.61–1.24)
GFR, Est AFR Am: >60 mL/min (ref >60)
GFR, Estimated: >60 mL/min (ref >60)
Glucose, Bld: 93 mg/dL (ref 70–99)
Potassium: 4.1 mmol/L (ref 3.5–5.1)
Sodium: 144 mmol/L (ref 135–145)
Total Bilirubin: 0.6 mg/dL (ref 0.3–1.2)
Total Protein: 6.7 g/dL (ref 6.5–8.1)

## 2019-03-30 LAB — CBC WITH DIFFERENTIAL (CANCER CENTER ONLY)
Abs Immature Granulocytes: 0 10*3/uL (ref 0.00–0.07)
Basophils Absolute: 0 10*3/uL (ref 0.0–0.1)
Basophils Relative: 0 %
Eosinophils Absolute: 0 10*3/uL (ref 0.0–0.5)
Eosinophils Relative: 1 %
HCT: 42.7 % (ref 39.0–52.0)
Hemoglobin: 13.9 g/dL (ref 13.0–17.0)
Immature Granulocytes: 0 %
Lymphocytes Relative: 32 %
Lymphs Abs: 0.9 10*3/uL (ref 0.7–4.0)
MCH: 25.5 pg — ABNORMAL LOW (ref 26.0–34.0)
MCHC: 32.6 g/dL (ref 30.0–36.0)
MCV: 78.2 fL — ABNORMAL LOW (ref 80.0–100.0)
Monocytes Absolute: 0.2 10*3/uL (ref 0.1–1.0)
Monocytes Relative: 7 %
Neutro Abs: 1.6 10*3/uL — ABNORMAL LOW (ref 1.7–7.7)
Neutrophils Relative %: 60 %
Platelet Count: 129 10*3/uL — ABNORMAL LOW (ref 150–400)
RBC: 5.46 MIL/uL (ref 4.22–5.81)
RDW: 14.2 % (ref 11.5–15.5)
WBC Count: 2.7 10*3/uL — ABNORMAL LOW (ref 4.0–10.5)
nRBC: 0 % (ref 0.0–0.2)

## 2019-03-30 LAB — PLATELET BY CITRATE

## 2019-03-30 LAB — SAVE SMEAR(SSMR), FOR PROVIDER SLIDE REVIEW

## 2019-03-30 NOTE — Progress Notes (Signed)
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  He denies any issue with infection.  His WBC count today is 2.7, Hgb 13.9 and platelet count 129.  No issues with bleeding. No abnormal bruising or petechial rash.  He has had surgery in the past including a posterior laminectomy and decompression of the cervical spine without any complications.  He states that he had a right lower extremity DVT and bilateral pulmonary emboli diagnosed in 2018. He states that this was idiopathic and that his work-up was negative. He had not been traveling, injured or got dehydrated.  He was first treated with Heparin, then sent home on Eliquis. He then switched to Xarelto due to chest discomfort when he took the Eliquis. He is currently on a maintenance dose of 10 mg PO daily which he states will be lifelong. He is followed by hematology with [facility], Adrian Cantu.  He has occasional cramping in the right calf where he had the previous DVT. [place] last week showed no evidence of DVT in the right lower extremity.  He is walking some for exercise. He sometimes gets a little winded going up the hills and will take a break to rest if needed.  He has occasional positional numbness and tingling in his thumbs and also the feet/ankles. No falls or syncopal episodes to report.  No personal or familial history of sickle cell disease or trait.  No personal history of cancer. His sister has breast cancer and also a history of thrombus. No other family history of cancer.  He had his  colonoscopy in April 2018 and the results were negative. He is currently on PT for right shoulder pain and states that he is improving.  No fever, chills, n/v, cough, rash, dizziness, chest pain, palpitations, abdominal pain or changes in bowel or bladder habits.  He has sinus congestion at times. This waxes and wanes.  He has maintained a good appetite and is staying well hydrated. His weight is described as stable.  He does not smoke, drink alcoholic beverages or use recreational drugs.  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  He is originally from [place], [place] and moved to this area 20 years ago.   ROS: All other 10 point review of systems is negative.   PAST MEDICAL HISTORY:   Past Medical History:  Diagnosis Date  . Degenerative cervical disc   . 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 Other Rash    Apple juice  . Shellfish Allergy Rash      MEDICATIONS:  Current Outpatient Medications on File Prior to Visit  Medication Sig Dispense Refill  . atorvastatin (LIPITOR) 20 MG tablet Take 20 mg by mouth daily.    Marland Kitchen FLUoxetine (PROZAC) 20 MG tablet Take 20 mg by mouth daily.    . risperiDONE (RISPERDAL) 2 MG tablet Take 2 mg by mouth at bedtime.    Marland Kitchen terazosin (HYTRIN) 2 MG capsule  Take 2 mg by mouth at bedtime.    Carlena Hurl 10 MG TABS tablet Take 10 mg by mouth every morning.     No current facility-administered medications on file prior to visit.     PAST SURGICAL HISTORY Past Surgical History:  Procedure Laterality Date  . BACK SURGERY    . CERVICAL DISC SURGERY      FAMILY HISTORY: Family History  Problem Relation Age of Onset  . Hyperlipidemia Mother   . Hypertension Mother   . Diabetes Father   . Diabetes Sister   . Hyperlipidemia Sister   . Hypertension Sister   . Diabetes Brother     SOCIAL HISTORY:  reports that he has never smoked. He has  never used smokeless tobacco. He reports that he does not drink alcohol or use drugs.  PERFORMANCE STATUS: The patient's performance status is 0 - Asymptomatic  PHYSICAL EXAM: Most Recent Vital Signs: Blood pressure 127/73, pulse 67, temperature (!) 97.1 F (36.2 C), temperature source Temporal, weight 189 lb (85.7 kg), SpO2 99 %. BP 127/73 (BP Location: Left Arm)   Pulse 67   Temp (!) 97.1 F (36.2 C) (Temporal)   Wt 189 lb (85.7 kg)   SpO2 99%   BMI 27.12 kg/m   General Appearance:    Alert, cooperative, no distress, appears stated age  Head:    Normocephalic, without obvious abnormality, atraumatic  Eyes:    PERRL, conjunctiva/corneas clear, EOM's intact, fundi    benign, both eyes             Throat:   Lips, mucosa, and tongue normal; teeth and gums normal  Neck:   Supple, symmetrical, trachea midline, no adenopathy;       thyroid:  No enlargement/tenderness/nodules; no carotid   bruit or JVD  Back:     Symmetric, no curvature, ROM normal, no CVA tenderness  Lungs:     Clear to auscultation bilaterally, respirations unlabored  Chest wall:    No tenderness or deformity  Heart:    Regular rate and rhythm, S1 and S2 normal, no murmur, rub   or gallop  Abdomen:     Soft, non-tender, bowel sounds active all four quadrants,    no masses, no organomegaly        Extremities:   Extremities normal, atraumatic, no cyanosis or edema  Pulses:   2+ and symmetric all extremities  Skin:   Skin color, texture, turgor normal, no rashes or lesions  Lymph nodes:   Cervical, supraclavicular, and axillary nodes normal  Neurologic:   CNII-XII intact. Normal strength, sensation and reflexes      throughout    LABORATORY DATA:  Results for orders placed or performed in visit on 03/30/19 (from the past 48 hour(s))  Platelet by Citrate     Status: None   Collection Time: 03/30/19  8:00 AM  Result Value Ref Range   Platelet CT in Citrate EDTA platelet count consistent with citrate.      Comment: Performed at [facility], [address] [place], [place] [address]  Save Sm[facility])     Status: None   Collection Time: 03/30/19  8:00 AM  Result Value Ref Range   Smear Review SMEAR STAINED AND AVAILABLE FOR REVIEW     Comment: Performed at [facility] at [facility], [address] [place], [place] [address]  CMP (Cancer Center only)     Status: None   Collection Time:  03/30/19  8:00 AM  Result Value Ref Range   Sodium 144 135 - 145 mmol/L   Potassium 4.1 3.5 - 5.1 mmol/L   Chloride 111 98 - 111 mmol/L   CO2 26 22 - 32 mmol/L   Glucose, Bld 93 70 - 99 mg/dL    Comment: Glucose reference range applies only to samples taken after fasting for at least 8 hours.   BUN 19 6 - 20 mg/dL   Creatinine 0.93 2.35 - 1.24 mg/dL   Calcium 9.3 8.9 - 57.3 mg/dL   Total Protein 6.7 6.5 - 8.1 g/dL   Albumin 4.3 3.5 - 5.0 g/dL   AST 23 15 - 41 U/L   ALT 36 0 - 44 U/L   Alkaline Phosphatase 64 38 - 126 U/L   Total Bilirubin 0.6 0.3 - 1.2 mg/dL   GFR, Est Non Af Am >22 >60 mL/min   GFR, Est AFR Am >60 >60 mL/min   Anion gap 7 5 - 15    Comment: Performed at [facility], [address] [place], [place] [address]  CBC with Differential (Cancer Center Only)     Status: Abnormal   Collection Time: 03/30/19  8:00 AM  Result Value Ref Range   WBC Count 2.7 (L) 4.0 - 10.5 K/uL   RBC 5.46 4.22 - 5.81 MIL/uL   Hemoglobin 13.9 13.0 - 17.0 g/dL   HCT 70.6 23.7 - 62.8 %   MCV 78.2 (L) 80.0 - 100.0 fL   MCH 25.5 (L) 26.0 - 34.0 pg   MCHC 32.6 30.0 - 36.0 g/dL   RDW 31.5 17.6 - 16.0 %   Platelet Count 129 (L) 150 - 400 K/uL   nRBC 0.0 0.0 - 0.2 %   Neutrophils Relative % 60 %   Neutro Abs 1.6 (L) 1.7 - 7.7 K/uL   Lymphocytes Relative 32 %   Lymphs Abs 0.9 0.7 - 4.0 K/uL   Monocytes Relative 7 %   Monocytes Absolute 0.2 0.1 - 1.0 K/uL   Eosinophils Relative 1 %   Eosinophils Absolute 0.0 0.0 - 0.5 K/uL    Basophils Relative 0 %   Basophils Absolute 0.0 0.0 - 0.1 K/uL   Immature Granulocytes 0 %   Abs Immature Granulocytes 0.00 0.00 - 0.07 K/uL    Comment: Performed at [facility] at [facility], [address] [place], [place] [address]      RADIOGRAPHY: No results found.     PATHOLOGY: None  ASSESSMENT/PLAN: Mr. 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  He continues to follow along with [facility] hematology for history of thrombus.  His platelet and WBC count's remain stable. He is asymptomatic with this.  At this point we can let him go from our office. No intervention needed.   All questions were answered and he is in agreement. He will contact our office with any new heme/onc questions or concerns. We can certainly see him again in the future if needed.   He was discussed with and also seen by Dr. Myna Hidalgo and he is in agreement with the aforementioned.   Emeline Gins, NP    Addendum: I saw and examined Mr. Majeed 2019 with Maralyn Sago.  I looked at his blood smear under the microscope.  I do not see any abnormal appearing white blood cells  or platelets.  He had good maturity of his white blood cells.  There were no hypersegmented polys.  There were no immature myeloid or lymphoid cells.  Platelets were well granulated.  This is not a new problem for him.  He has had leukopenia and thrombocytopenia for 6 years.  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  This is seen about 20-25% of African-Americans.  The thrombocytopenia should not be a factor at all with respect to him being on Eliquis.  His thromboembolic disease to be managed by a hematologist at [facility].  I am not sure why he is still on a blood thinner even though has been 3 years I think since he has had the pulmonary  embolism and DVT in his right leg.  At this point, I just do not think that we really need to see him back.  He is very nice.  He was fun to talk to.  I do still think that we are really going to be adding to his medical care.  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  I do not think he has myelodysplasia.  I suppose that the leukopenia and thrombocytopenia could also be from medications.  Again, I would not switch him off his medications.  We spent about 45 minutes with Mr. Haskew.  He is very interesting.  He is fun to talk to.  He is originally from [place], [place].  Christin Bach, MD

## 2019-03-31 ENCOUNTER — Ambulatory Visit: Payer: Medicare Other | Admitting: Physical Therapy

## 2019-03-31 ENCOUNTER — Other Ambulatory Visit: Payer: Self-pay

## 2019-03-31 ENCOUNTER — Encounter: Payer: Self-pay | Admitting: Physical Therapy

## 2019-03-31 DIAGNOSIS — M25511 Pain in right shoulder: Secondary | ICD-10-CM

## 2019-03-31 DIAGNOSIS — M6281 Muscle weakness (generalized): Secondary | ICD-10-CM

## 2019-03-31 NOTE — Therapy (Signed)
[place], [place], [address] Phone: [phone]   Fax:  [phone]  Physical Therapy Treatment  Patient Details  Name: Adrian Cantu MRN: [id number] Date of Birth: [birth year] Referring Provider (PT): Dr. Velora Heckler   Encounter Date: 03/31/2019  PT End of Session - 03/31/19 0941    Visit Number  6    Number of Visits  16    Date for PT Re-Evaluation  05/03/19    Authorization Type  UHC MCR MCD    PT Start Time  1000    PT Stop Time  1040    PT Time Calculation (min)  40 min    Activity Tolerance  Patient tolerated treatment well    Behavior During Therapy  [facility] for tasks assessed/performed       Past Medical History:  Diagnosis Date  . Degenerative cervical disc   . 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 BACK SURGERY    . CERVICAL DISC SURGERY      There were no vitals filed for this visit.  Subjective Assessment - 03/31/19 0940    Subjective  Patient reports pain will come and go, pain mainly occurs when laying on his side.    Patient Stated Goals  Pan relief    Currently in Pain?  No/denies         [facility] PT Assessment - 03/31/19 0001      AROM   Right Shoulder Flexion  160 Degrees   equal to opposite side                  [facility] Adult PT Treatment/Exercise - 03/31/19 0001      Exercises   Exercises  Shoulder;Neck      Shoulder Exercises: Supine   Other Supine Exercises  Supine flexion through full ROM with 3# 2x12    Other Supine Exercises  Supine shoulder circles with 3# at 90 deg 2x15 cw/ccw      Shoulder Exercises: Standing   External Rotation  20 reps   2 sets   Theraband Level (Shoulder External Rotation)  Level 3 (Green)    Internal Rotation  20 reps   2 sets   Theraband Level (Shoulder Internal Rotation)  Level 3 (Green)    Flexion  15 reps   2 sets   Shoulder Flexion Weight  (lbs)  2    Flexion Limitations  thumb up to 90 deg    ABduction  15 reps   2 sets   Shoulder ABduction Weight (lbs)  2    ABduction Limitations  palm down to 90 deg    Extension  20 reps   2 sets   Theraband Level (Shoulder Extension)  Level 3 (Green)    Row  20 reps   2 sets   Theraband Level (Shoulder Row)  Level 4 (Blue)      Shoulder Exercises: Pulleys   Flexion  2 minutes      Shoulder Exercises: ROM/Strengthening   UBE (Upper Arm Bike)  5 mins L3 (half forward/ half retro )              PT Education - 03/31/19 0940    Education Details  HEP    Person(s) Educated  Patient    Methods  Explanation;Demonstration;Verbal cues    Comprehension  Verbalized understanding;Returned demonstration;Verbal cues required;Need further instruction  PT Short Term Goals - 03/22/19 1336      PT SHORT TERM GOAL #1   Title  Pt will be I with HEP for Rt UE, posture    Time  4    Period  Weeks    Status  Achieved      PT SHORT TERM GOAL #2   Title  Pt will understand his FOTO score and his ability to improve score, functional mobility    Baseline  73%    Time  4    Period  Weeks    Status  On-going      PT SHORT TERM GOAL #3   Title  Pt will be able to use Rt UE for ADLs with min pain in Rt arm    Baseline  pain averaging 1-2/10    Time  4    Period  Weeks    Status  Achieved      PT SHORT TERM GOAL #4   Title  Pt will report min occasional sensory disturbance in Rt lower arm    Baseline  10 days without sendory disturbance -03/22/19    Time  4    Period  Weeks    Status  Achieved        PT Long Term Goals - 03/08/19 1849      PT LONG TERM GOAL #1   Title  Pt will be able to improve FOTO score to less than 50% impaired overall    Baseline  73%    Time  8    Period  Weeks    Status  New    Target Date  05/03/19      PT LONG TERM GOAL #2   Title  Pt will be able to grab with handle on the bus with Rt UE with min increase in pain    Time  8    Period   Days    Status  New    Target Date  05/03/19      PT LONG TERM GOAL #3   Title  Pt will be able to sit with corrected posture with min cues.    Time  8    Period  Weeks    Status  New    Target Date  05/03/19      PT LONG TERM GOAL #4   Title  Pt will demo Rt shoulder strength to 4/5 or more for maximal use of Ue and postural support    Time  8    Period  Weeks    Status  New    Target Date  05/03/19      PT LONG TERM GOAL #5   Title  Pt will be I with final HEP for UE, posture and strength    Time  8    Period  Weeks    Status  New    Target Date  05/03/19            Plan - 03/31/19 0942    Clinical Impression Statement  Patient tolerated therapy well with no adverse effects. Therapy focused mainly on strengthening this visit and patient did not report any pain with exercises. He does require consistent cueing to avoid shrug with exercises and he exhibits fatigue with strengthening greater on right. He would benefit from continued skilled PT to progress strength and motion to return to prior level of function.    PT Treatment/Interventions  ADLs/Self Care Home Management;Electrical Stimulation;Cryotherapy;Iontophoresis 4mg /ml Dexamethasone;Moist Heat;Ultrasound;Functional  mobility training;Therapeutic activities;Neuromuscular re-education;Manual techniques;Taping;Passive range of motion;Therapeutic exercise;Dry needling    PT Next Visit Plan  Assess and review HEP. Progress ther ex for ROM and strength as indicated. Utilize modalities as indicated.    PT Home Exercise Plan  [id number], [id number] ([place])    Consulted and Agree with Plan of Care  Patient       Patient will benefit from skilled therapeutic intervention in order to improve the following deficits and impairments:  Decreased range of motion, Decreased strength, Decreased mobility, Increased fascial restricitons, Impaired sensation, Impaired UE functional use, Pain, Postural dysfunction, Impaired  flexibility  Visit Diagnosis: Acute pain of right shoulder  Muscle weakness (generalized)     Problem List Patient Active Problem List   Diagnosis Date Noted  . Leukopenia 03/11/2019  . Thrombocytopenia (HCC) 03/11/2019  . Mixed hyperlipidemia 03/11/2019  . Chest pain 02/18/2019  . Pain in both lower extremities 02/18/2019  . History of DVT (deep vein thrombosis) 02/18/2019  . History of pulmonary embolism 02/18/2019    Rosana Hoes, PT, DPT, LAT, ATC 03/31/19  10:41 AM Phone: [phone] Fax: [phone]   [facility] [address] [place], [place], [address] Phone: [phone]   Fax:  [phone]  Name: Tavares Levinson MRN: [id number] Date of Birth: 12-13-1965

## 2019-04-05 ENCOUNTER — Ambulatory Visit: Payer: Medicare Other | Admitting: Physical Therapy

## 2019-04-05 ENCOUNTER — Other Ambulatory Visit: Payer: Self-pay

## 2019-04-05 ENCOUNTER — Encounter: Payer: Self-pay | Admitting: Physical Therapy

## 2019-04-05 DIAGNOSIS — M25511 Pain in right shoulder: Secondary | ICD-10-CM

## 2019-04-05 DIAGNOSIS — M6281 Muscle weakness (generalized): Secondary | ICD-10-CM

## 2019-04-05 NOTE — Therapy (Signed)
[place], [place], [address] Phone: [phone]   Fax:  [phone]  Physical Therapy Treatment  Patient Details  Name: Adrian Cantu MRN: [id number] Date of Birth: 06/11/1965 Referring Provider (PT): Dr. Velora Heckler   Encounter Date: 04/05/2019  PT End of Session - 04/05/19 1223    Visit Number  7    Number of Visits  16    Date for PT Re-Evaluation  05/03/19    Authorization Type  UHC MCR MCD    PT Start Time  1214    PT Stop Time  1256    PT Time Calculation (min)  42 min    Activity Tolerance  Patient tolerated treatment well    Behavior During Therapy  [facility] for tasks assessed/performed       Past Medical History:  Diagnosis Date  . Degenerative cervical disc   . 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 BACK SURGERY    . CERVICAL DISC SURGERY      There were no vitals filed for this visit.  Subjective Assessment - 04/05/19 1219    Subjective  Patient has been watching videos to work on his belly fat, is planning on joining the [employer].  Shoulder does not hurt at rest.  I lifted a heavy bag of trash and that made it hurt.    Currently in Pain?  No/denies        [facility] Adult PT Treatment/Exercise - 04/05/19 0001      Neck Exercises: Machines for Strengthening   UBE (Upper Arm Bike)  5 min L1       Shoulder Exercises: Supine   Horizontal ABduction  Strengthening;Both;20 reps;Theraband    Theraband Level (Shoulder Horizontal ABduction)  Level 2 (Red)      Shoulder Exercises: Standing   External Rotation  Right;20 reps    Theraband Level (Shoulder External Rotation)  Level 3 (Green)    Extension  Both;20 reps;Theraband    Theraband Level (Shoulder Extension)  Level 3 (Green)    Other Standing Exercises  lat pull down red band x 10       Shoulder Exercises: ROM/Strengthening   Wall Pushups  10 reps    Wall  Pushups Limitations  2 sets, wide and narrow elbows       Manual Therapy   Manual Therapy  Joint mobilization;Passive ROM    Passive ROM  ER at various angles of Abd       Neck Exercises: Stretches   Corner Stretch  4 reps;30 seconds               PT Short Term Goals - 03/22/19 1336      PT SHORT TERM GOAL #1   Title  Pt will be I with HEP for Rt UE, posture    Time  4    Period  Weeks    Status  Achieved      PT SHORT TERM GOAL #2   Title  Pt will understand his FOTO score and his ability to improve score, functional mobility    Baseline  73%    Time  4    Period  Weeks    Status  On-going      PT SHORT TERM GOAL #3   Title  Pt will be able to use Rt UE for ADLs with min pain in Rt arm  Baseline  pain averaging 1-2/10    Time  4    Period  Weeks    Status  Achieved      PT SHORT TERM GOAL #4   Title  Pt will report min occasional sensory disturbance in Rt lower arm    Baseline  10 days without sendory disturbance -03/22/19    Time  4    Period  Weeks    Status  Achieved        PT Long Term Goals - 03/08/19 1849      PT LONG TERM GOAL #1   Title  Pt will be able to improve FOTO score to less than 50% impaired overall    Baseline  73%    Time  8    Period  Weeks    Status  New    Target Date  05/03/19      PT LONG TERM GOAL #2   Title  Pt will be able to grab with handle on the bus with Rt UE with min increase in pain    Time  8    Period  Days    Status  New    Target Date  05/03/19      PT LONG TERM GOAL #3   Title  Pt will be able to sit with corrected posture with min cues.    Time  8    Period  Weeks    Status  New    Target Date  05/03/19      PT LONG TERM GOAL #4   Title  Pt will demo Rt shoulder strength to 4/5 or more for maximal use of Ue and postural support    Time  8    Period  Weeks    Status  New    Target Date  05/03/19      PT LONG TERM GOAL #5   Title  Pt will be I with final HEP for UE, posture and strength    Time   8    Period  Weeks    Status  New    Target Date  05/03/19            Plan - 04/05/19 1258    Clinical Impression Statement  Pt educated on posture, advice on shoulder impingement, anatomy and symptom mgmt.  He has improved in strength and AROM of shoulder however, cont to exhibit poor posture.  May consider finishing POC or going on hold as he is feeling better, [facility] and lives in [place].    PT Treatment/Interventions  ADLs/Self Care Home Management;Electrical Stimulation;Cryotherapy;Iontophoresis 4mg /ml Dexamethasone;Moist Heat;Ultrasound;Functional mobility training;Therapeutic activities;Neuromuscular re-education;Manual techniques;Taping;Passive range of motion;Therapeutic exercise;Dry needling    PT Next Visit Plan  Assess and review HEP. Progress ther ex for ROM and strength as indicated. Utilize modalities as indicated. GYM EXERCISES< FOTO    PT Home Exercise Plan  [id number], [id number] ([place])    Consulted and Agree with Plan of Care  Patient       Patient will benefit from skilled therapeutic intervention in order to improve the following deficits and impairments:  Decreased range of motion, Decreased strength, Decreased mobility, Increased fascial restricitons, Impaired sensation, Impaired UE functional use, Pain, Postural dysfunction, Impaired flexibility  Visit Diagnosis: Acute pain of right shoulder  Muscle weakness (generalized)     Problem List Patient Active Problem List   Diagnosis Date Noted  . Leukopenia 03/11/2019  . Thrombocytopenia (HCC) 03/11/2019  . Mixed  hyperlipidemia 03/11/2019  . Chest pain 02/18/2019  . Pain in both lower extremities 02/18/2019  . History of DVT (deep vein thrombosis) 02/18/2019  . History of pulmonary embolism 02/18/2019    Danyon Mcginness 04/05/2019, 1:01 PM  [facility] [address] [place], [place], [address] Phone: [phone]   Fax:   [phone]  Name: Lenvil Swaim MRN: [id number] Date of Birth: [birth year]  Karie Mainland, PT 04/05/19 1:01 PM Phone: [phone] Fax: [phone]

## 2019-04-06 ENCOUNTER — Other Ambulatory Visit: Payer: Self-pay

## 2019-04-06 ENCOUNTER — Encounter: Payer: Self-pay | Admitting: Internal Medicine

## 2019-04-06 ENCOUNTER — Ambulatory Visit: Payer: Medicare Other | Attending: Family Medicine | Admitting: Family Medicine

## 2019-04-06 DIAGNOSIS — K921 Melena: Secondary | ICD-10-CM | POA: Diagnosis not present

## 2019-04-06 DIAGNOSIS — Z7901 Long term (current) use of anticoagulants: Secondary | ICD-10-CM

## 2019-04-06 DIAGNOSIS — D696 Thrombocytopenia, unspecified: Secondary | ICD-10-CM

## 2019-04-06 DIAGNOSIS — Z86718 Personal history of other venous thrombosis and embolism: Secondary | ICD-10-CM

## 2019-04-06 DIAGNOSIS — Z86711 Personal history of pulmonary embolism: Secondary | ICD-10-CM

## 2019-04-06 NOTE — Progress Notes (Signed)
Having droplets in stool sometimes. Its not every day. Per pt he is on a blood thinner and wants to know if its related

## 2019-04-06 NOTE — Progress Notes (Signed)
Virtual Visit via Telephone Note  I connected with Adrian Cantu on 04/06/19 at  2:10 PM EDT by telephone and verified that I am speaking with the correct person using two identifiers.   I discussed the limitations, risks, security and privacy concerns of performing an evaluation and management service by telephone and the availability of in person appointments. I also discussed with the patient that there may be a patient responsible charge related to this service. The patient expressed understanding and agreed to proceed.  Patient Location: Home Provider Location: [facility] Office Others participating in call: none   History of Present Illness:        54 year old male who is on chronic anticoagulation with Xarelto status post a past lower extremity DVT and pulmonary embolism who reports that over the past few weeks he is seen some droplets of blood in the stool.  He does not feel as if he has had any significant constipation and no abdominal pain.  He has occasional discomfort when having a bowel movement when he sees the flecks of blood in the stool but at other times he has no pain and sees small flecks of blood in the stool.  Upon questioning, he does feel that a few months ago he had some black, tarry stools..  He denies any current epigastric tenderness, no substernal chest pain or burning sensation, no sensation of backwash of bad tasting liquid into the mouth or throat.  He wonders if he needs further evaluation for the droplets of blood that he has seen in his stool due to the fact that he is on anticoagulant medication.  He denies any recent increase in fatigue, no shortness of breath or cough, no chest pain or palpitations.  No increase in peripheral edema.  He reports that he has had a prior colonoscopy at [facility] possibly 5 to 6 years ago.  Past Medical History:  Diagnosis Date  . Degenerative cervical disc   . DVT of proximal leg (deep vein  thrombosis) (HCC)     Past Surgical History:  Procedure Laterality Date  . BACK SURGERY    . CERVICAL DISC SURGERY      Family History  Problem Relation Age of Onset  . Hyperlipidemia Mother   . Hypertension Mother   . Diabetes Father   . Diabetes Sister   . Hyperlipidemia Sister   . Hypertension Sister   . Diabetes Brother     Social History   Tobacco Use  . Smoking status: Never Smoker  . Smokeless tobacco: Never Used  Substance Use Topics  . Alcohol use: No  . Drug use: No     Allergies  Allergen Reactions  . Other Rash    Apple juice  . Shellfish Allergy Rash       Observations/Objective: No vital signs or physical exam conducted as visit was done via telephone  Assessment and Plan: 1. Flecks of blood in stool Discussed with the patient that since he has seen recent flecks of blood in the stool in addition to possible melena that he needs further evaluation by gastroenterology.  He will be referred to gastroenterology but is also advised to seek further medical attention at the ED if he has large amount of bright red blood per rectum, abdominal pain, or return of black/tarry stools. - Ambulatory referral to Gastroenterology  2. Chronic anticoagulation; 4.  History of DVT-deep vein thrombosis; 5.  History of pulmonary embolism He will be referred to gastroenterology for  further evaluation of flecks of blood in the stool and possible melena.  He is currently chronically on Xarelto due to prior DVT and pulmonary embolism.  Patient is not sure if he was told that he would need to be on the medication for lifetime - Ambulatory referral to Gastroenterology  3. Thrombocytopenia ([place]) On review of chart, patient has also been undergoing evaluation by hematology for thrombocytopenia which could be contributing to his current issues with possible GI bleeding in addition to his long-term use of anticoagulant medication.  Patient's most recent platelet count on 03/30/2019 on  review of chart was 129.    Follow Up Instructions: Return for Chronic issues-keep scheduled follow-up with PCP and as needed.    I discussed the assessment and treatment plan with the patient. The patient was provided an opportunity to ask questions and all were answered. The patient agreed with the plan and demonstrated an understanding of the instructions.   The patient was advised to call back or seek an in-person evaluation if the symptoms worsen or if the condition fails to improve as anticipated.  I provided 11 minutes of non-face-to-face time during this encounter.   Antony Blackbird, MD

## 2019-04-07 ENCOUNTER — Encounter: Payer: Self-pay | Admitting: Nurse Practitioner

## 2019-04-07 ENCOUNTER — Ambulatory Visit: Payer: Medicare Other | Admitting: Physical Therapy

## 2019-04-09 ENCOUNTER — Encounter: Payer: Self-pay | Admitting: Family Medicine

## 2019-04-13 ENCOUNTER — Ambulatory Visit (INDEPENDENT_AMBULATORY_CARE_PROVIDER_SITE_OTHER): Payer: Medicare Other | Admitting: Nurse Practitioner

## 2019-04-13 ENCOUNTER — Encounter: Payer: Self-pay | Admitting: Nurse Practitioner

## 2019-04-13 ENCOUNTER — Other Ambulatory Visit: Payer: Self-pay

## 2019-04-13 VITALS — BP 106/70 | HR 86 | Temp 98.9°F | Ht 70.0 in | Wt 199.0 lb

## 2019-04-13 DIAGNOSIS — K625 Hemorrhage of anus and rectum: Secondary | ICD-10-CM | POA: Diagnosis not present

## 2019-04-13 DIAGNOSIS — R140 Abdominal distension (gaseous): Secondary | ICD-10-CM | POA: Diagnosis not present

## 2019-04-13 DIAGNOSIS — K921 Melena: Secondary | ICD-10-CM | POA: Diagnosis not present

## 2019-04-13 NOTE — Progress Notes (Signed)
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  He presents today as referred by his PCP Dr. Antony Blackbird for further evaluation regarding specs of blood in his stool. He reports seeing bright red blood embedded in his stool and sometimes sees specs of blood in his stool which occurs once every 2 weeks since he received his first Covid 19 vaccination 03/23/2019. He is due to receive his 2nd Covid 19 vaccine later today. No associated abdominal or rectal pain. He reported passing solid black stool intermittently which started about 1 year ago. No Pepto bismol or iron use.  He started drinking lemon water 6 months ago and he noticed his stools were no longer black in color so he continues to drink lemon water daily. He denies having any dysphagia or heartburn. He has intermittent burping. He has mid central abdominal pain which occurs randomly once monthly and lasts for 1 minute then goes away. No specific food or stress triggers.  He has frequent abdominal bloat. He does not feel constipated. No NSAID use. No recent antibiotics. He underwent a colonoscopy 05/01/2016 to rule out a colon malignancy after he had his DVT/PE at [facility] which was normal. No fever, sweats or chills. No weight loss.   In review of his records in [place], he was seen by Dr. Darrick Meigs B. Sonnie Alamo 06/10/2016 with complaints of black stools. At that time, his rectal exam did not show any evidence of melena and IFOBT was negative.   He underwent a hematology consult at [facility] on 04/18/2016 due to having chronic leukopenia and thrombocytopenia. At that time, his  labs were unchanged from 2016 and it was assessed his counts just run on the lower side of normal. His RLE DVT was not mentioned during his hematology consult. He then developed bilateral PE admitted to [facility] 04/28/2016.   He was seen by hematologist at [facility] on 12/07/2017, he was instructed to continue Xarelto 20mg  daily and to follow up in office in 2 and 9 months.    CBC Latest Ref Rng & Units 03/30/2019 02/11/2019 01/18/2019  WBC 4.0 - 10.5 K/uL 2.7(L) 3.2(L) 3.1(L)  Hemoglobin 13.0 - 17.0 g/dL 13.9 15.1 15.6  Hematocrit 39.0 - 52.0 % 42.7 47.7 48.8  Platelets 150 - 400 K/uL 129(L) 133(L) 169    Past Medical History:  Diagnosis Date  . Chronic headache   . Degenerative cervical disc   . Depressed   . 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 Elevated cholesterol   . H/O blood clots    Past Surgical History:  Procedure Laterality Date  . BACK SURGERY    . CERVICAL DISC SURGERY     disc infusion C4  . COLONOSCOPY  04/29/2016   [facility]. Normal  . ESOPHAGOGASTRODUODENOSCOPY  04/29/2016   [facility]. Normal   Social History:  He is single. No children. Nonsmoker. No alcohol. No drug use.   Family History: Sister with breast cancer and DVT on Eliquis.  Mother  Age 100. HTN, hypercholesterolemia. Father died 39 Dm complications. Brother died 25 DM complications.    Allergies  Allergen Reactions  .  Other Rash    Apple juice  . Shellfish Allergy Rash      Outpatient Encounter Medications as of 04/13/2019  Medication Sig  . atorvastatin (LIPITOR) 20 MG tablet Take 10 mg by mouth daily.   Marland Kitchen FLUoxetine (PROZAC) 10 MG capsule Take 10 mg by mouth daily.  . risperiDONE (RISPERDAL) 2 MG tablet Take 2 mg by mouth at bedtime.  Marland Kitchen terazosin (HYTRIN) 2 MG capsule Take 2 mg by mouth at bedtime.  Carlena Hurl 10 MG TABS tablet Take 10 mg by mouth every morning.  . [DISCONTINUED] FLUoxetine (PROZAC) 20 MG tablet Take 20 mg by mouth daily.    No facility-administered encounter medications on file as of 04/13/2019.     REVIEW OF SYSTEMS: All other systems reviewed and negative except where noted in the History of Present Illness.   PHYSICAL EXAM: BP 106/70   Pulse 86   Temp 98.9 F (37.2 C)   Ht 5\' 10"  (1.778 m)   Wt 199 lb (90.3 kg)   BMI 28.55 kg/m  General: Well developed  54 year old male in no acute distress. Head: Normocephalic and atraumatic. Eyes:  Sclerae non-icteric, conjunctive pink. Ears: Normal auditory acuity. Mouth: Dentition intact. No ulcers or lesions.  Neck: Supple, no lymphadenopathy or thyromegaly.  Lungs: Clear bilaterally to auscultation without wheezes, crackles or rhonchi. Heart: Regular rate and rhythm. No murmur, rub or gallop appreciated.  Abdomen: Soft, nontender, non distended. No masses. No hepatosplenomegaly. Normoactive bowel sounds x 4 quadrants.  Rectal: No external hemorrhoids or fissures. Stool in the rectum was light brown guaiac negative. No mass. Kellie RN present during exam.  Musculoskeletal: Symmetrical with no gross deformities. Skin: Warm and dry. No rash or lesions on visible extremities. Extremities: No edema. Neurological: Alert oriented x 4, no focal deficits.  Psychological:  Alert and cooperative. Normal mood and affect.  ASSESSMENT AND PLAN:  53. 54 year old male on Xarelto with history of an unprovoked RLE DVT and PE in 2018 presents with complaints of blood in his stool. Rectal exam today showed normal light brown stool guaiac negative. Black solid stools reported by the patient one year ago which resolved 6 months ago.  -Check CBC in 4 weeks -Heme slides, patient to complete when he sees red areas in stool and if he sees black stools -Follow up in the office in 4 weeks to further discuss scheduling an EGD +/- repeat colonoscopy   2. Abdominal bloat  -FODMAP handout -Phillip's bacteria probiotic once daily   3. Chronic leukopenia and thrombocytopenia. No  anemia. Normal LFTs and albumin level. Previously evaluated by hematology at [facility] [place].  -Consider abdominal sonogram to evaluate the liver and spleen, unlikely cirrhosis. Defer further recommendations to Dr. [place] at time of his follow up appointment in 4 weeks.     CC:  Barron Alvine, MD

## 2019-04-13 NOTE — Patient Instructions (Addendum)
Please review FODMAP hand out Complete heme cards when you see red blood in your stool  Your provider has requested that you go to the basement level for lab work at [address] [place] in [place] [address]. Press "B" on the elevator. The lab is located at the first door on the left as you exit the elevator.    Please purchase the following medications over the counter and take as directed: Vear Clock bacteria probiotic once daily for abdominal bloat  Thank you for entrusting me with your care and choosing [facility].

## 2019-04-18 ENCOUNTER — Other Ambulatory Visit (INDEPENDENT_AMBULATORY_CARE_PROVIDER_SITE_OTHER): Payer: Medicare Other

## 2019-04-18 DIAGNOSIS — R140 Abdominal distension (gaseous): Secondary | ICD-10-CM | POA: Diagnosis not present

## 2019-04-18 DIAGNOSIS — K625 Hemorrhage of anus and rectum: Secondary | ICD-10-CM | POA: Diagnosis not present

## 2019-04-18 DIAGNOSIS — K921 Melena: Secondary | ICD-10-CM | POA: Diagnosis not present

## 2019-04-18 LAB — CBC
HCT: 42.9 % (ref 39.0–52.0)
Hemoglobin: 14.3 g/dL (ref 13.0–17.0)
MCHC: 33.3 g/dL (ref 30.0–36.0)
MCV: 77.8 fl — ABNORMAL LOW (ref 78.0–100.0)
Platelets: 144 10*3/uL — ABNORMAL LOW (ref 150.0–400.0)
RBC: 5.51 Mil/uL (ref 4.22–5.81)
RDW: 14.5 % (ref 11.5–15.5)
WBC: 4.4 10*3/uL (ref 4.0–10.5)

## 2019-04-18 NOTE — Progress Notes (Signed)
Agree with the assessment and plan as outlined by [name], NP.   [name], DO, FACG [facility]   

## 2019-04-25 ENCOUNTER — Encounter: Payer: Self-pay | Admitting: Physical Therapy

## 2019-04-25 ENCOUNTER — Ambulatory Visit: Payer: Medicare Other | Attending: Internal Medicine

## 2019-04-25 ENCOUNTER — Ambulatory Visit: Payer: Medicare Other | Attending: Sports Medicine | Admitting: Physical Therapy

## 2019-04-25 ENCOUNTER — Other Ambulatory Visit: Payer: Self-pay

## 2019-04-25 DIAGNOSIS — M6281 Muscle weakness (generalized): Secondary | ICD-10-CM | POA: Insufficient documentation

## 2019-04-25 DIAGNOSIS — M25511 Pain in right shoulder: Secondary | ICD-10-CM | POA: Diagnosis present

## 2019-04-25 DIAGNOSIS — R945 Abnormal results of liver function studies: Secondary | ICD-10-CM

## 2019-04-25 DIAGNOSIS — R7989 Other specified abnormal findings of blood chemistry: Secondary | ICD-10-CM

## 2019-04-25 NOTE — Therapy (Signed)
[place], [place], [address] Phone: [phone]   Fax:  [phone]  Physical Therapy Treatment / Discharge  Patient Details  Name: Adrian Cantu MRN: [id number] Date of Birth: [birth year] Referring Provider (PT): Dr. Velora Heckler   Encounter Date: 04/25/2019  PT End of Session - 04/25/19 1156    Visit Number  8    Number of Visits  16    Date for PT Re-Evaluation  05/03/19    Authorization Type  UHC MCR MCD    PT Start Time  0912    PT Stop Time  1000    PT Time Calculation (min)  48 min    Activity Tolerance  Patient tolerated treatment well    Behavior During Therapy  [facility] for tasks assessed/performed       Past Medical History:  Diagnosis Date  . Chronic headache   . Degenerative cervical disc   . Depressed   . 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 Elevated cholesterol   . H/O blood clots     Past Surgical History:  Procedure Laterality Date  . BACK SURGERY    . CERVICAL DISC SURGERY     disc infusion C4  . COLONOSCOPY  04/29/2016   [facility]. Normal  . ESOPHAGOGASTRODUODENOSCOPY  04/29/2016   [facility]. Normal    There were no vitals filed for this visit.  Subjective Assessment - 04/25/19 0922    Subjective  Patient reports his shoulder sometimes feels good and sometimes feels bad. It mainly bothers him when he is laying on his side and patient believes that his neck positioning may be bothering his shoulder.    Patient Stated Goals  Pain relief    Currently in Pain?  No/denies         [facility] PT Assessment - 04/25/19 0001      Assessment   Medical Diagnosis  Rt shoulder impingement, cervical radiculopathy    Referring Provider (PT)  Dr. Velora Heckler    Onset Date/Surgical Date  --   Dec. 2020    Hand Dominance  Right    Next MD Visit  None scheduled    Prior Therapy  Yes      Precautions   Precautions  None      Restrictions   Weight  Bearing Restrictions  No      Balance Screen   Has the patient fallen in the past 6 months  No      Prior Function   Level of Independence  Independent    Vocation  Unemployed    Leisure  Going to gym and walking      Cognition   Overall Cognitive Status  Within Functional Limits for tasks assessed      Observation/Other Assessments   Focus on Therapeutic Outcomes (FOTO)   29% limitation   shoulder     Posture/Postural Control   Posture Comments  Patient exhibits rounded shoulder and forward head posture, he is able to correct with cueing and is able to verbalize proper posture       AROM   AROM Assessment Site  Shoulder   Shoulder AROM equal bilaterally   Right Shoulder Flexion  160 Degrees    Right Shoulder ABduction  160 Degrees   patient reports a little discomfort   Right Shoulder Internal Rotation  --   T8   Right Shoulder External Rotation  --   T4  Strength   Right Shoulder Flexion  5/5    Right Shoulder ABduction  5/5    Right Shoulder Internal Rotation  5/5    Right Shoulder External Rotation  5/5      Palpation   Palpation comment  Non-TTP, increased upper trap tension      Transfers   Transfers  Independent with all Transfers                   [facility] Adult PT Treatment/Exercise - 04/25/19 0001      Self-Care   Self-Care  Posture;Other Self-Care Comments    Posture  Continued postural correction and sleep positioning, avoid shrugging shoulders    Other Self-Care Comments   Use of ball for self massage as needed      Exercises   Exercises  Shoulder;Neck      Neck Exercises: Machines for Strengthening   UBE (Upper Arm Bike)  L4 x 4 min (3 fwd/bwd)      Neck Exercises: Seated   Neck Retraction  10 reps;3 secs    Neck Retraction Limitations  verbal and tactile cueing for proper form      Shoulder Exercises: Standing   External Rotation  15 reps   2 sets, towel under arm   Theraband Level (Shoulder External Rotation)  Level 3 (Green)     Internal Rotation  15 reps   2 sets, towel under arm   Theraband Level (Shoulder Internal Rotation)  Level 3 (Green)    Flexion  10 reps   2 sets, scaption   Shoulder Flexion Weight (lbs)  3    Extension  20 reps   2 sets   Theraband Level (Shoulder Extension)  Level 3 (Green)    Row  20 reps   2 sets   Theraband Level (Shoulder Row)  Level 4 (Blue)    Other Standing Exercises  Wall push-up with plus 2x10      Neck Exercises: Stretches   Upper Trapezius Stretch  2 reps;20 seconds    Levator Stretch  2 reps;20 seconds    Corner Stretch  2 reps;20 seconds             PT Education - 04/25/19 0935    Education Details  HEP, avoid shrugging shoulders    Person(s) Educated  Patient    Methods  Explanation;Demonstration;Verbal cues;Handout    Comprehension  Returned demonstration;Verbal cues required;Verbalized understanding       PT Short Term Goals - 04/25/19 1147      PT SHORT TERM GOAL #1   Title  Pt will be I with HEP for Rt UE, posture    Time  4    Period  Weeks    Status  Achieved      PT SHORT TERM GOAL #2   Title  Pt will understand his FOTO score and his ability to improve score, functional mobility    Time  4    Period  Weeks    Status  Achieved      PT SHORT TERM GOAL #3   Title  Pt will be able to use Rt UE for ADLs with min pain in Rt arm    Time  4    Period  Weeks    Status  Achieved      PT SHORT TERM GOAL #4   Title  Pt will report min occasional sensory disturbance in Rt lower arm    Time  4    Period  Weeks    Status  Achieved        PT Long Term Goals - 04/25/19 1148      PT LONG TERM GOAL #1   Title  Pt will be able to improve FOTO score to less than 50% impaired overall    Time  8    Period  Weeks    Status  Achieved      PT LONG TERM GOAL #2   Title  Pt will be able to grab with handle on the bus with Rt UE with min increase in pain    Time  8    Period  Days    Status  Achieved      PT LONG TERM GOAL #3   Title  Pt  will be able to sit with corrected posture with min cues.    Time  8    Period  Weeks    Status  Achieved      PT LONG TERM GOAL #4   Title  Pt will demo Rt shoulder strength to 4/5 or more for maximal use of Ue and postural support    Time  8    Period  Weeks    Status  Achieved      PT LONG TERM GOAL #5   Title  Pt will be I with final HEP for UE, posture and strength    Time  8    Period  Weeks    Status  Achieved            Plan - 04/25/19 0958    Clinical Impression Statement  Patient has demonstrated great improvement since evaluation and exhibits normalized range of motion and strength of the right shoulder. He does continue to report occassional discomfort of the right shoulder specifically when lying on his side. He does continue to require cueing for proper posture and to avoid shrugging with exercises but is able to correct following cues. He has achieved all established and is ready to transition to independent HEP to continue imrpovement. He will be discharged from formal PT and patient in agreement with this plan.    PT Treatment/Interventions  ADLs/Self Care Home Management;Electrical Stimulation;Cryotherapy;Iontophoresis 60m/ml Dexamethasone;Moist Heat;Ultrasound;Functional mobility training;Therapeutic activities;Neuromuscular re-education;Manual techniques;Taping;Passive range of motion;Therapeutic exercise;Dry needling    PT Next Visit Plan  NA - patient discharged    PT Home Exercise Plan  [email or web address]    Consulted and Agree with Plan of Care  Patient       Patient will benefit from skilled therapeutic intervention in order to improve the following deficits and impairments:  Decreased range of motion, Decreased strength, Decreased mobility, Increased fascial restricitons, Impaired sensation, Impaired UE functional use, Pain, Postural dysfunction, Impaired flexibility  Visit Diagnosis: Acute pain of right shoulder  Muscle weakness (generalized)     Problem  List Patient Active Problem List   Diagnosis Date Noted  . Blood in stool 04/13/2019  . Leukopenia 03/11/2019  . [place] 03/[place]  . Mixed hyperlipidemia 03/11/2019  . Chest pain 02/18/2019  . Pain in both lower extremities 02/18/2019  . History of DVT (deep vein thrombosis) 02/18/2019  . History of pulmonary embolism 02/18/2019    [facility] [place] [address]Phone: [phone]  Fax:  [phone] Name: Adrian StefanskiMRN: [id number] of Birth: 217-Dec-1967  PHYSICAL THERAPY DISCHARGE SUMMARY  Visits from Start of Care: 8  Current functional level related to goals /  functional outcomes: See above   Remaining deficits: See above   Education / Equipment: HEP, theraband Plan: Patient agrees to discharge.  Patient goals were met. Patient is being discharged due to meeting the stated rehab goals.  ?????    Hilda Blades, PT, DPT, LAT, ATC 04/25/19  12:05 PM Phone: [phone] Fax: [phone]

## 2019-04-25 NOTE — Patient Instructions (Signed)
Access Code: [email or web address] URL: [email or web address] Date: 04/25/2019 Prepared by: Rosana Hoes  Exercises Banded Row - 1 x daily - 5 x weekly - 2 sets - 20 reps Shoulder Extension with Band - 1 x daily - 5 x weekly - 2 sets - 20 reps Shoulder External Rotation with Anchored Resistance - 5 x weekly - 2 sets - 20 reps Shoulder Internal Rotation with Resistance - 5 x weekly - 2 sets - 20 reps Wall Push Up with Plus - 5 x weekly - 2 sets - 15 reps Scaption with Dumbbells - 1 x daily - 5 x weekly - 2 sets - 15 reps Seated Cervical Retraction - 2-3 x daily - 7 x weekly - 10 reps - 5 seconds hold Seated Cervical Sidebending Stretch - 2-3 x daily - 7 x weekly - 2 reps - 20 seconds hold Seated Levator Scapulae Stretch - 2-3 x daily - 7 x weekly - 2 reps - 20 seconds hold Doorway Pec Stretch at 90 Degrees Abduction - 2-3 x daily - 7 x weekly - 2 reps - 20 seconds hold

## 2019-04-26 LAB — HEPATIC FUNCTION PANEL
ALT: 22 IU/L (ref 0–44)
AST: 19 IU/L (ref 0–40)
Albumin: 4.7 g/dL (ref 3.8–4.9)
Alkaline Phosphatase: 88 IU/L (ref 39–117)
Bilirubin Total: 0.3 mg/dL (ref 0.0–1.2)
Bilirubin, Direct: 0.09 mg/dL (ref 0.00–0.40)
Total Protein: 7.4 g/dL (ref 6.0–8.5)

## 2019-05-09 ENCOUNTER — Ambulatory Visit: Payer: Medicare Other | Admitting: Gastroenterology

## 2019-05-10 ENCOUNTER — Encounter: Payer: Self-pay | Admitting: Internal Medicine

## 2019-05-11 ENCOUNTER — Other Ambulatory Visit: Payer: Self-pay

## 2019-05-11 ENCOUNTER — Emergency Department (HOSPITAL_COMMUNITY)
Admission: EM | Admit: 2019-05-11 | Discharge: 2019-05-11 | Disposition: A | Discharge status: Home / Self Care | Payer: Medicare Other | Attending: Emergency Medicine | Admitting: Emergency Medicine

## 2019-05-11 ENCOUNTER — Emergency Department (HOSPITAL_BASED_OUTPATIENT_CLINIC_OR_DEPARTMENT_OTHER)
Admit: 2019-05-11 | Discharge: 2019-05-11 | Disposition: A | Discharge status: Home / Self Care | Payer: Medicare Other | Attending: Emergency Medicine | Admitting: Emergency Medicine

## 2019-05-11 ENCOUNTER — Encounter (HOSPITAL_COMMUNITY): Payer: Self-pay | Admitting: Emergency Medicine

## 2019-05-11 DIAGNOSIS — M79662 Pain in left lower leg: Secondary | ICD-10-CM | POA: Diagnosis not present

## 2019-05-11 DIAGNOSIS — M7989 Other specified soft tissue disorders: Secondary | ICD-10-CM

## 2019-05-11 DIAGNOSIS — Z7901 Long term (current) use of anticoagulants: Secondary | ICD-10-CM | POA: Diagnosis not present

## 2019-05-11 DIAGNOSIS — R609 Edema, unspecified: Secondary | ICD-10-CM

## 2019-05-11 DIAGNOSIS — M79661 Pain in right lower leg: Secondary | ICD-10-CM | POA: Insufficient documentation

## 2019-05-11 DIAGNOSIS — Z79899 Other long term (current) drug therapy: Secondary | ICD-10-CM | POA: Diagnosis not present

## 2019-05-11 DIAGNOSIS — M79606 Pain in leg, unspecified: Secondary | ICD-10-CM

## 2019-05-11 DIAGNOSIS — R2243 Localized swelling, mass and lump, lower limb, bilateral: Secondary | ICD-10-CM | POA: Diagnosis present

## 2019-05-11 LAB — CBC WITH DIFFERENTIAL/PLATELET
Abs Immature Granulocytes: 0.01 10*3/uL (ref 0.00–0.07)
Basophils Absolute: 0 10*3/uL (ref 0.0–0.1)
Basophils Relative: 0 %
Eosinophils Absolute: 0.1 10*3/uL (ref 0.0–0.5)
Eosinophils Relative: 2 %
HCT: 43.2 % (ref 39.0–52.0)
Hemoglobin: 13.9 g/dL (ref 13.0–17.0)
Immature Granulocytes: 0 %
Lymphocytes Relative: 32 %
Lymphs Abs: 1 10*3/uL (ref 0.7–4.0)
MCH: 25.6 pg — ABNORMAL LOW (ref 26.0–34.0)
MCHC: 32.2 g/dL (ref 30.0–36.0)
MCV: 79.4 fL — ABNORMAL LOW (ref 80.0–100.0)
Monocytes Absolute: 0.3 10*3/uL (ref 0.1–1.0)
Monocytes Relative: 10 %
Neutro Abs: 1.6 10*3/uL — ABNORMAL LOW (ref 1.7–7.7)
Neutrophils Relative %: 56 %
Platelets: 142 10*3/uL — ABNORMAL LOW (ref 150–400)
RBC: 5.44 MIL/uL (ref 4.22–5.81)
RDW: 14.6 % (ref 11.5–15.5)
WBC: 3 10*3/uL — ABNORMAL LOW (ref 4.0–10.5)
nRBC: 0 % (ref 0.0–0.2)

## 2019-05-11 LAB — BASIC METABOLIC PANEL
Anion gap: 10 (ref 5–15)
BUN: 16 mg/dL (ref 6–20)
CO2: 23 mmol/L (ref 22–32)
Calcium: 9.5 mg/dL (ref 8.9–10.3)
Chloride: 107 mmol/L (ref 98–111)
Creatinine, Ser: 1.28 mg/dL — ABNORMAL HIGH (ref 0.61–1.24)
GFR calc Af Amer: >60 mL/min (ref >60)
GFR calc non Af Amer: >60 mL/min (ref >60)
Glucose, Bld: 88 mg/dL (ref 70–99)
Potassium: 3.9 mmol/L (ref 3.5–5.1)
Sodium: 140 mmol/L (ref 135–145)

## 2019-05-11 LAB — PROTIME-INR
INR: 1 (ref 0.8–1.2)
Prothrombin Time: 13.2 seconds (ref 11.4–15.2)

## 2019-05-11 NOTE — ED Triage Notes (Signed)
Patient reports bilateral calves swelling onset last night , currently taking Xarelto for PE , he is concerned of blood clots after receiving Covid vaccination , respirations unlabored .

## 2019-05-11 NOTE — ED Notes (Signed)
Patient verbalizes understanding of discharge instructions. Opportunity for questioning and answers were provided. Armband removed by staff, pt discharged from ED.  

## 2019-05-11 NOTE — Discharge Instructions (Signed)
You were evaluated in the Emergency Department and after careful evaluation, we did not find any emergent condition requiring admission or further testing in the hospital. ° °Your exam/testing today was overall reassuring. ° °Please return to the Emergency Department if you experience any worsening of your condition.  We encourage you to follow up with a primary care provider.  Thank you for allowing us to be a part of your care. ° °

## 2019-05-11 NOTE — Progress Notes (Signed)
Lower extremity venous has been completed.   Preliminary results in CV Proc.   Blanch Media 05/11/2019 8:21 AM

## 2019-05-11 NOTE — ED Provider Notes (Signed)
[facility] EMERGENCY DEPARTMENT Provider Note  CSN: [id number] Arrival date & time: 05/11/19 0359  Chief Complaint(s) Calves Swelling  HPI Adrian Cantu is a 54 y.o. 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  No associated pain.  Patient is concerned that this is possible blood clots.  Patient took a [employer] bus from [place] to [place] here as he prefers [employer] system.  Patient reports a single missed dose of Xarelto last week.  Otherwise patient has been completely compliant with his medication.  He denies any prolonged travel.  He does report getting the second dose of the vaccine 1 month ago.  Denies any recent fevers or infections.  No trauma.  No swelling.  No redness.  No other physical complaints.  HPI  Past Medical History Past Medical History:  Diagnosis Date  . Chronic headache   . Degenerative cervical disc   . Depressed   . 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 Elevated cholesterol   . H/O blood clots    Patient Active Problem List   Diagnosis Date Noted  . Blood in stool 04/13/2019  . Leukopenia 03/11/2019  . Thrombocytopenia ([facility]) 03/11/2019  . Mixed hyperlipidemia 03/11/2019  . Chest pain 02/18/2019  . Pain in both lower extremities 02/18/2019  . History of DVT (deep vein thrombosis) 02/18/2019  . History of pulmonary embolism 02/18/2019   Home Medication(s) Prior to Admission medications   Medication Sig Start Date End Date Taking? Authorizing Provider  atorvastatin (LIPITOR) 20 MG tablet Take 10 mg by mouth daily.  02/01/19  Yes [provider]  FLUoxetine (PROZAC) 10 MG capsule Take 10 mg by mouth daily. 04/12/19  Yes [provider]  ibuprofen (ADVIL) 600 MG tablet Take 600 mg by mouth every 8 (eight) hours as needed for mild pain.  05/06/19  Yes [provider]  risperiDONE  (RISPERDAL) 2 MG tablet Take 2 mg by mouth at bedtime.   Yes [provider]  terazosin (HYTRIN) 2 MG capsule Take 2 mg by mouth at bedtime. 01/24/19  Yes [provider]  XARELTO 10 MG TABS tablet Take 10 mg by mouth every evening.  12/15/18  Yes [provider]                                                                                                                                    Past Surgical History Past Surgical History:  Procedure Laterality Date  . BACK SURGERY    . CERVICAL DISC SURGERY     disc infusion C4  . COLONOSCOPY  04/29/2016   [facility]. Normal  . ESOPHAGOGASTRODUODENOSCOPY  04/29/2016   [facility] Normal   Family History Family History  Problem Relation Age of Onset  . Hyperlipidemia Mother   .  Hypertension Mother   . Diabetes Father   . Diabetes Sister   . Hyperlipidemia Sister   . Hypertension Sister   . Diabetes Brother   . Colon cancer Neg Hx   . Esophageal cancer Neg Hx     Social History Social History   Tobacco Use  . Smoking status: Never Smoker  . Smokeless tobacco: Never Used  Substance Use Topics  . Alcohol use: No  . Drug use: No   Allergies Other and Shellfish allergy  Review of Systems Review of Systems All other systems are reviewed and are negative for acute change except as noted in the HPI  Physical Exam Vital Signs  I have reviewed the triage vital signs BP 111/79 (BP Location: Right Arm)   Pulse (!) 49   Temp 97.9 F (36.6 C) (Oral)   Resp 14   Ht 5\' 10"  (1.778 m)   Wt 80 kg   SpO2 98%   BMI 25.31 kg/m   Physical Exam Vitals reviewed.  Constitutional:      General: He is not in acute distress.    Appearance: He is well-developed. He is not diaphoretic.  HENT:     Head: Normocephalic and atraumatic.     Jaw: No trismus.     Right Ear: External ear normal.     Left Ear: External ear normal.     Nose: Nose normal.  Eyes:     General: No scleral icterus.     Conjunctiva/sclera: Conjunctivae normal.  Neck:     Trachea: Phonation normal.  Cardiovascular:     Rate and Rhythm: Normal rate and regular rhythm.  Pulmonary:     Effort: Pulmonary effort is normal. No respiratory distress.     Breath sounds: No stridor.  Abdominal:     General: There is no distension.  Musculoskeletal:        General: Normal range of motion.     Cervical back: Normal range of motion.     Right lower leg: No swelling or tenderness.     Left lower leg: No swelling or tenderness.  Neurological:     Mental Status: He is alert and oriented to person, place, and time.  Psychiatric:        Behavior: Behavior normal.     ED Results and Treatments Labs (all labs ordered are listed, but only abnormal results are displayed) Labs Reviewed  CBC WITH DIFFERENTIAL/PLATELET - Abnormal; Notable for the following components:      Result Value   WBC 3.0 (*)    MCV 79.4 (*)    MCH 25.6 (*)    Platelets 142 (*)    Neutro Abs 1.6 (*)    All other components within normal limits  BASIC METABOLIC PANEL - Abnormal; Notable for the following components:   Creatinine, Ser 1.28 (*)    All other components within normal limits  PROTIME-INR  EKG  EKG Interpretation  Date/Time:    Ventricular Rate:    PR Interval:    QRS Duration:   QT Interval:    QTC Calculation:   R Axis:     Text Interpretation:        Radiology VAS [place] LOWER EXTREMITY VENOUS (DVT) (ONLY [facility] & [facility])  Result Date: 05/11/2019  Lower Venous DVTStudy Indications: Swelling, and Edema.  Comparison Study: no prior Performing Technologist: Blanch Media RVS  Examination Guidelines: A complete evaluation includes B-mode imaging, spectral Doppler, color Doppler, and power Doppler as needed of all accessible portions of each vessel. Bilateral testing is considered an integral part of a complete  examination. Limited examinations for reoccurring indications may be performed as noted. The reflux portion of the exam is performed with the patient in reverse Trendelenburg.  +---------+---------------+---------+-----------+----------+--------------+ RIGHT    CompressibilityPhasicitySpontaneityPropertiesThrombus Aging +---------+---------------+---------+-----------+----------+--------------+ CFV      Full           Yes      Yes                                 +---------+---------------+---------+-----------+----------+--------------+ SFJ      Full                                                        +---------+---------------+---------+-----------+----------+--------------+ FV Prox  Full                                                        +---------+---------------+---------+-----------+----------+--------------+ FV Mid   Full                                                        +---------+---------------+---------+-----------+----------+--------------+ FV DistalFull                                                        +---------+---------------+---------+-----------+----------+--------------+ PFV      Full                                                        +---------+---------------+---------+-----------+----------+--------------+ POP      Full           Yes      Yes                                 +---------+---------------+---------+-----------+----------+--------------+ PTV      Full                                                        +---------+---------------+---------+-----------+----------+--------------+  PERO     Full                                                        +---------+---------------+---------+-----------+----------+--------------+   +---------+---------------+---------+-----------+----------+--------------+ LEFT     CompressibilityPhasicitySpontaneityPropertiesThrombus Aging  +---------+---------------+---------+-----------+----------+--------------+ CFV      Full           Yes      Yes                                 +---------+---------------+---------+-----------+----------+--------------+ SFJ      Full                                                        +---------+---------------+---------+-----------+----------+--------------+ FV Prox  Full                                                        +---------+---------------+---------+-----------+----------+--------------+ FV Mid   Full                                                        +---------+---------------+---------+-----------+----------+--------------+ FV DistalFull                                                        +---------+---------------+---------+-----------+----------+--------------+ PFV      Full                                                        +---------+---------------+---------+-----------+----------+--------------+ POP      Full           Yes      Yes                                 +---------+---------------+---------+-----------+----------+--------------+ PTV      Full                                                        +---------+---------------+---------+-----------+----------+--------------+ PERO     Full                                                        +---------+---------------+---------+-----------+----------+--------------+  Summary: BILATERAL: - No evidence of deep vein thrombosis seen in the lower extremities, bilaterally.   *See table(s) above for measurements and observations. Electronically signed by Harold Barban MD on 05/11/2019 at 8:42:34 PM.    Final     Pertinent labs & imaging results that were available during my care of the patient were reviewed by me and considered in my medical decision making (see chart for details).  Medications Ordered in ED Medications - No data to display                                                                                                                                   Procedures Procedures  (including critical care time)  Medical Decision Making / ED Course I have reviewed the nursing notes for this encounter and the patient's prior records (if available in EHR or on provided paperwork).   Colt Martelle was evaluated in Emergency Department on 05/11/2019 for the symptoms described in the history of present illness. He was evaluated in the context of the global COVID-19 pandemic, which necessitated consideration that the patient might be at risk for infection with the SARS-CoV-2 virus that causes COVID-19. Institutional protocols and algorithms that pertain to the evaluation of patients at risk for COVID-19 are in a state of rapid change based on information released by regulatory bodies including the CDC and federal and state organizations. These policies and algorithms were followed during the patient's care in the ED.  DVT ultrasound negative bilaterally.  No evidence of cellulitis.  Pulses intact.      Final Clinical Impression(s) / ED Diagnoses Final diagnoses:  Pain of lower extremity, unspecified laterality   The patient appears reasonably screened and/or stabilized for discharge and I doubt any other medical condition or other [facility] requiring further screening, evaluation, or treatment in the ED at this time prior to discharge. Safe for discharge with strict return precautions.  Disposition: Discharge  Condition: Good  I have discussed the results, Dx and Tx plan with the patient/family who expressed understanding and agree(s) with the plan. Discharge instructions discussed at length. The patient/family was given strict return precautions who verbalized understanding of the instructions. No further questions at time of discharge.    ED Discharge Orders    None        This chart was dictated using voice recognition software.  Despite  best efforts to proofread,  errors can occur which can change the documentation meaning.   Fatima Blank, MD 05/11/19 [phone]

## 2019-05-27 ENCOUNTER — Encounter (HOSPITAL_COMMUNITY): Payer: Self-pay | Admitting: Emergency Medicine

## 2019-05-27 ENCOUNTER — Emergency Department (HOSPITAL_COMMUNITY): Payer: Medicare Other

## 2019-05-27 ENCOUNTER — Encounter: Payer: Self-pay | Admitting: Internal Medicine

## 2019-05-27 ENCOUNTER — Emergency Department (HOSPITAL_COMMUNITY)
Admission: EM | Admit: 2019-05-27 | Discharge: 2019-05-27 | Disposition: A | Discharge status: Home / Self Care | Payer: Medicare Other | Attending: Emergency Medicine | Admitting: Emergency Medicine

## 2019-05-27 DIAGNOSIS — Z5321 Procedure and treatment not carried out due to patient leaving prior to being seen by health care provider: Secondary | ICD-10-CM | POA: Insufficient documentation

## 2019-05-27 DIAGNOSIS — R079 Chest pain, unspecified: Secondary | ICD-10-CM | POA: Diagnosis present

## 2019-05-27 LAB — BASIC METABOLIC PANEL
Anion gap: 8 (ref 5–15)
BUN: 12 mg/dL (ref 6–20)
CO2: 25 mmol/L (ref 22–32)
Calcium: 9.5 mg/dL (ref 8.9–10.3)
Chloride: 108 mmol/L (ref 98–111)
Creatinine, Ser: 1.23 mg/dL (ref 0.61–1.24)
GFR calc Af Amer: >60 mL/min (ref >60)
GFR calc non Af Amer: >60 mL/min (ref >60)
Glucose, Bld: 103 mg/dL — ABNORMAL HIGH (ref 70–99)
Potassium: 4.3 mmol/L (ref 3.5–5.1)
Sodium: 141 mmol/L (ref 135–145)

## 2019-05-27 LAB — CBC
HCT: 46.1 % (ref 39.0–52.0)
Hemoglobin: 14.6 g/dL (ref 13.0–17.0)
MCH: 25.3 pg — ABNORMAL LOW (ref 26.0–34.0)
MCHC: 31.7 g/dL (ref 30.0–36.0)
MCV: 80 fL (ref 80.0–100.0)
Platelets: 155 10*3/uL (ref 150–400)
RBC: 5.76 MIL/uL (ref 4.22–5.81)
RDW: 14.7 % (ref 11.5–15.5)
WBC: 3.2 10*3/uL — ABNORMAL LOW (ref 4.0–10.5)
nRBC: 0 % (ref 0.0–0.2)

## 2019-05-27 LAB — TROPONIN I (HIGH SENSITIVITY): Troponin I (High Sensitivity): 4 ng/L (ref <18)

## 2019-05-27 MED ORDER — SODIUM CHLORIDE 0.9% FLUSH: 3.0000 mL | Freq: Once | INTRAVENOUS | Status: DC

## 2019-05-27 NOTE — ED Notes (Signed)
Called pt name x4 for VS recheck, no response from pt.  °

## 2019-05-27 NOTE — ED Triage Notes (Signed)
Pt states over the last 1 wekk has had pretty regular right sided chest pain that goes into his right arm- seems to occur after taking his daily medications. Pt has no SOB.

## 2019-05-31 ENCOUNTER — Inpatient Hospital Stay: Payer: Medicare Other | Admitting: Critical Care Medicine

## 2019-06-20 ENCOUNTER — Encounter: Payer: Self-pay | Admitting: Internal Medicine

## 2019-06-20 DIAGNOSIS — Z0100 Encounter for examination of eyes and vision without abnormal findings: Secondary | ICD-10-CM

## 2019-07-12 ENCOUNTER — Telehealth: Payer: Self-pay | Admitting: Internal Medicine

## 2019-07-12 NOTE — Telephone Encounter (Signed)
[facility]) was reaching out as had received a note stating that the Letter Of Engagement she had sent out in June had been questioned office staff who had thought an attachment would have been attached. Adrian Cantu is verifying that the letter did not have an attachment the letter was just her touching base. If there are further questions she can be reached at [phone]

## 2019-07-13 NOTE — Telephone Encounter (Signed)
Please f/u as appropriate.

## 2019-07-25 ENCOUNTER — Ambulatory Visit: Payer: Medicare Other | Attending: Sports Medicine

## 2019-07-25 ENCOUNTER — Other Ambulatory Visit: Payer: Self-pay

## 2019-07-25 DIAGNOSIS — M25511 Pain in right shoulder: Secondary | ICD-10-CM | POA: Diagnosis present

## 2019-07-26 NOTE — Patient Instructions (Addendum)
°  Use of light weight, water bottle to aid distraction

## 2019-08-01 NOTE — Therapy (Addendum)
[place], [place], [address] Phone: [phone]   Fax:  [phone]  Physical Therapy Evaluation/DC Summary  Patient Details  Name: Adrian Cantu MRN: [id number] Date of Birth: 1965-06-12 Referring Provider (PT): Joelene Millin, MD   Encounter Date: 07/25/2019   PT End of Session - 08/01/19 1509    Visit Number 1    Number of Visits 13    Date for PT Re-Evaluation 09/13/19    Authorization Type UHC MCR MCD    Progress Note Due on Visit 10    PT Start Time 1004    PT Stop Time 1104    PT Time Calculation (min) 60 min    Activity Tolerance Patient tolerated treatment well    Behavior During Therapy [facility] for tasks assessed/performed           Past Medical History:  Diagnosis Date  . Chronic headache   . Degenerative cervical disc   . Depressed   . DVT of proximal leg (deep vein thrombosis) ([facility] [place])   . Elevated cholesterol   . H/O blood clots     Past Surgical History:  Procedure Laterality Date  . BACK SURGERY    . CERVICAL DISC SURGERY     disc infusion C4  . COLONOSCOPY  04/29/2016   [facility]. Normal  . ESOPHAGOGASTRODUODENOSCOPY  04/29/2016   [facility]. Normal    There were no vitals filed for this visit.    Subjective Assessment - 08/01/19 1505    Subjective Pt reports the R shoulder pain developed 12/15/19 when he slept on a floor. Pt has a pain range of 0-10/10 with the pain most significant with reaching above his head and and out to the side, and laying on his R side.    Pertinent History Rt LE blood clot, C4 disc surgery, schizophrenia    Limitations Other (comment)   reaching out to the side   How long can you sit comfortably? Not an issue    How long can you stand comfortably? Not an issue    How long can you walk comfortably? Not an issue    Diagnostic tests 02/02/19- No fracture or dislocation.  No evident arthropathy.    Patient Stated Goals for R shoulder pain to  resolve    Currently in Pain? Yes    Pain Score 8     Pain Location Shoulder    Pain Orientation Right    Pain Descriptors / Indicators Sharp    Pain Type Chronic pain    Pain Radiating Towards Localized; pt reports intermittent ant. elbow pain and R thumb numbness which seems more related to his neck and R shoulder    Pain Onset Other (comment)   12/15/18   Pain Frequency Intermittent    Aggravating Factors  Reach up and out to the side; laying on the R side    Pain Relieving Factors Laying on L side; limiting how far he reaches with the r arm    Effect of Pain on Daily Activities Sleeping, reaching up, to the side and down to put on pants    Multiple Pain Sites No              [facility] PT Assessment - 08/01/19 0001      Assessment   Medical Diagnosis Neuralgia and neuritis, unspecified; Chronic pain in right shoulder    Referring Provider (PT) Joelene Millin, MD    Onset Date/Surgical Date --   12/15/18  Hand Dominance Right    Prior Therapy Yes      Precautions   Precautions None      Restrictions   Weight Bearing Restrictions No      Balance Screen   Has the patient fallen in the past 6 months No    Has the patient had a decrease in activity level because of a fear of falling?  No    Is the patient reluctant to leave their home because of a fear of falling?  No      Prior Function   Level of Independence Independent    Vocation Unemployed      Cognition   Overall Cognitive Status Within Functional Limits for tasks assessed      Observation/Other Assessments   Focus on Therapeutic Outcomes (FOTO)  57% Limitation      Sensation   Light Touch Appears Intact      Posture/Postural Control   Posture/Postural Control Postural limitations    Postural Limitations Rounded Shoulders;Forward head      ROM / Strength   AROM / PROM / Strength AROM      AROM   Overall AROM Comments Shoulder AROMs were equal to each other at approx 160d of flexion. Pt experience R  shoulder pain at the end ranges of flexion, abd, and horizonal abduction with lateral shoulder pain. Cervical AROMs did not reprouce pt's R shoulder pain.      Strength   Right Shoulder Flexion 5/5    Right Shoulder ABduction 5/5    Right Shoulder Internal Rotation 5/5    Right Shoulder External Rotation 5/5      Palpation   Palpation comment Pt was not tender to palpation of the R [place] jt area; pain location was IDed in the ant/lateral and lateral [place] jt area.      Special Tests    Special Tests Rotator Cuff Impingement    Rotator Cuff Impingment tests Michel Bickers test;Full Can test;Empty Can test;Drop Arm test;Speed's test      Hawkins-Kennedy test   Findings Positive    Side Right      Empty Can test   Findings Negative    Side Right      Full Can test   Findings Negative    Side Right      Drop Arm test   Findings Negative    Side Right      Speed's test   Findings Negative    Side Right      Transfers   Transfers Sit to Stand;Stand to Sit    Sit to Stand 7: Independent      Ambulation/Gait   Ambulation/Gait Yes    Ambulation/Gait Assistance 7: Independent    Gait Pattern Within Functional Limits                      Objective measurements completed on examination: See above findings.                 PT Short Term Goals - 08/01/19 1511      PT SHORT TERM GOAL #1   Title Pt will be ind in an initial HEP    Baseline started 7/19    Time 3    Period Weeks    Status New    Target Date 08/16/19      PT SHORT TERM GOAL #2   Title Review FOTO score and how PT can assist with the improvement of his functional  abilities    Time 3    Period Weeks    Status New    Target Date 08/16/19      PT SHORT TERM GOAL #3   Title Pt will report understanding of measures to reduce and manage R shoulder pain    Time 3    Period Weeks    Status New    Target Date 08/16/19             PT Long Term Goals - 08/01/19 1512      PT LONG  TERM GOAL #1   Title pt will report an improvement in R shoulder pain to a range of 0-4 with daily activities    Baseline 0-10    Time 7    Period Weeks    Status New      PT LONG TERM GOAL #2   Title Pt will be able to reach with is R arm to shoulder levelwithout difficulty    Baseline much difficulty per FOTO    Time 7    Period Weeks    Status New      PT LONG TERM GOAL #3   Title Pt's FOTO score will improve to 37% limited    Baseline 57% limited    Time 7    Period Weeks    Status New      PT LONG TERM GOAL #4   Title Pt will be Ind in a final HEP    Time 7    Period Weeks    Status New                  Plan - 08/01/19 1510    Clinical Impression Statement Pt's returns to PT demonstrating appropriate ROM and strength of the R shoulder. Pt is concerned about the degree of intermittent R shoulder pain he is having especially at the end ranges of flexion, abd, and horizonal abd. Pt was positive hawkins/kennedy test and signs and symptoms seem most consistent with shoulder impingement. Pt was started o an initial HEP to address rounded shoulder posture and pendulum ex for pain reduction. pt will benefit from PT 2w6 for for pain reduction and management, postural correction, and ROM and strengthening exs for the R shoulder.    Personal Factors and Comorbidities Comorbidity 1;Behavior Pattern;Social Background    Comorbidities neck surgery, lack of social support, sedentary, mental health    Examination-Activity Limitations Reach Overhead;Bed Mobility;Carry;Lift    Examination-Participation Restrictions Community Activity    Stability/Clinical Decision Making Evolving/Moderate complexity    Rehab Potential Fair    PT Frequency 2x / week    PT Duration 6 weeks    PT Treatment/Interventions ADLs/Self Care Home Management;Electrical Stimulation;Cryotherapy;Iontophoresis 12m/ml Dexamethasone;Moist Heat;Ultrasound;Functional mobility training;Therapeutic  activities;Neuromuscular re-education;Manual techniques;Taping;Passive range of motion;Therapeutic exercise;Dry needling;Patient/family education;Vasopneumatic Device    PT Next Visit Plan Assess response to HEP; assess GH jt mobility, progress exs, ionto as indicated    PT Home Exercise Plan [id number]    Consulted and Agree with Plan of Care Patient           Patient will benefit from skilled therapeutic intervention in order to improve the following deficits and impairments:  Decreased range of motion, Decreased strength, Decreased mobility, Increased fascial restricitons, Impaired sensation, Impaired UE functional use, Pain, Postural dysfunction, Impaired flexibility  Visit Diagnosis: Acute pain of right shoulder     Problem List Patient Active Problem List   Diagnosis Date Noted  . Blood in stool 04/13/2019  .  Leukopenia 03/11/2019  . Thrombocytopenia ([place]) 03/11/2019  . Mixed hyperlipidemia 03/11/2019  . Chest pain 02/18/2019  . Pain in both lower extremities 02/18/2019  . History of DVT (deep vein thrombosis) 02/18/2019  . History of pulmonary embolism 02/18/2019    Gar Ponto MS, PT 08/01/19 3:14 PM   PHYSICAL THERAPY DISCHARGE SUMMARY  Visits from Start of Care: 1  Current functional level related to goals / functional outcomes: Unknown, pt self DCed   Remaining deficits: Unknown, pt self DCed   Education / Equipment: Initial HEP Plan: Patient agrees to discharge.  Patient goals were not met. Patient is being discharged due to not returning since the last visit.  ?????         Gar Ponto MS, PT 01/31/20 10:36 AM      [facility] [address] [place], [place], [address] Phone: [phone]   Fax:  [phone]  Name: Adrian Cantu MRN: [id number] Date of Birth: November 03, 1965

## 2019-08-04 ENCOUNTER — Ambulatory Visit: Payer: Medicare Other | Admitting: Physical Therapy

## 2019-08-05 ENCOUNTER — Emergency Department (HOSPITAL_COMMUNITY)
Admission: EM | Admit: 2019-08-05 | Discharge: 2019-08-05 | Disposition: A | Discharge status: Home / Self Care | Payer: Medicare Other | Attending: Emergency Medicine | Admitting: Emergency Medicine

## 2019-08-05 ENCOUNTER — Encounter (HOSPITAL_COMMUNITY): Payer: Self-pay | Admitting: Emergency Medicine

## 2019-08-05 DIAGNOSIS — R200 Anesthesia of skin: Secondary | ICD-10-CM

## 2019-08-05 DIAGNOSIS — Z79899 Other long term (current) drug therapy: Secondary | ICD-10-CM | POA: Insufficient documentation

## 2019-08-05 DIAGNOSIS — R202 Paresthesia of skin: Secondary | ICD-10-CM | POA: Insufficient documentation

## 2019-08-05 DIAGNOSIS — Z7901 Long term (current) use of anticoagulants: Secondary | ICD-10-CM | POA: Diagnosis not present

## 2019-08-05 NOTE — ED Triage Notes (Signed)
Pt. Stated, I also had a shot in my shoulder pain, so not sure if its related.

## 2019-08-05 NOTE — Social Work (Signed)
After Pt was discharged, CSW was called to lobby as Pt needed transportation. CSW utilized [employer] to transport Pt home.

## 2019-08-05 NOTE — ED Triage Notes (Signed)
Pt. Stated, I started having tingling in my rt. Arm especially in the crease of my arm , right. Yesterday and I have this pain right in the center of my stomach.

## 2019-08-05 NOTE — Discharge Instructions (Signed)
Please call Dr. Laural Benes as well as Dr. Randall An regarding today's encounter.    Your history is suggestive of paresthesia involving the right forearm.  At time of my examination, your symptoms have improved.  Your physical exam is very reassuring.  Please review the attachment on paresthesia.   I have placed a referral for you for physical therapy given your diagnosed shoulder impingement syndrome.    Return to the ED or seek immediate medical attention should you experience any new or worsening symptoms.

## 2019-08-05 NOTE — ED Provider Notes (Signed)
[facility] EMERGENCY DEPARTMENT Provider Note   CSN: [id number] Arrival date & time: 08/05/19  7371     History Chief Complaint  Patient presents with  . Arm Pain    tingling  . Abdominal Pain    Adrian Cantu is a 54 y.o. 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  I reviewed patient's medical record and he was recently evaluated by Dr. Randall An, orthopedic surgery, on 08/02/2019 and diagnosed with impingement syndrome of right shoulder.  Patient has a history of PE and DVT, takes Xarelto 10 mg p.o. daily.  Patient was treated for his impingement syndrome with a cortisone injection using 40 mg Kenalog.  Patient had a cervical spine CT performed 03/03/2018 which demonstrated multilevel degenerative changes most pronounced at C4-C5.  Patient has a history of ACDF.  Patient states that he was resting comfortably in bed last evening at approximately 7 PM when he developed tingling, itching, burning sensation involving his C5-T1 dermatomes.  It was effectively the entire volar aspect of his forearm between his elbow and wrist.  Patient repeatedly states that it feels like "poison ivory".  He denies any overlying skin changes.  He thought that perhaps his veins were becoming more engorged, but states that it has since subsided.  He also notes some mild epigastric/periumbilical discomfort that he has had on and off intermittently.  He denies any fevers or chills, new trauma, head or neck discomfort, worsening shoulder pain, diminished ROM or strength, persisting numbness or tingling, IVDA, alcohol use, abdominal tenderness, nausea or vomiting, or other symptoms.    HPI     Past Medical History:  Diagnosis Date  . Chronic headache   . Degenerative cervical disc   . Depressed   . 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 Elevated cholesterol   . H/O blood clots      Patient Active Problem List   Diagnosis Date Noted  . Blood in stool 04/13/2019  . Leukopenia 03/11/2019  . Thrombocytopenia ([facility]) 03/11/2019  . Mixed hyperlipidemia 03/11/2019  . Chest pain 02/18/2019  . Pain in both lower extremities 02/18/2019  . History of DVT (deep vein thrombosis) 02/18/2019  . History of pulmonary embolism 02/18/2019    Past Surgical History:  Procedure Laterality Date  . BACK SURGERY    . CERVICAL DISC SURGERY     disc infusion C4  . COLONOSCOPY  04/29/2016   [facility]. Normal  . ESOPHAGOGASTRODUODENOSCOPY  04/29/2016   [facility]. Normal       Family History  Problem Relation Age of Onset  . Hyperlipidemia Mother   . Hypertension Mother   . Diabetes Father   . Diabetes Sister   . Hyperlipidemia Sister   . Hypertension Sister   . Diabetes Brother   . Colon cancer Neg Hx   . Esophageal cancer Neg Hx     Social History   Tobacco Use  . Smoking status: Never Smoker  . Smokeless tobacco: Never Used  Vaping Use  . Vaping Use: Never used  Substance Use Topics  . Alcohol use: No  . Drug use: No    Home Medications Prior to Admission medications   Medication Sig Start Date End Date Taking? Authorizing Provider  atorvastatin (LIPITOR) 20 MG tablet Take 10 mg by mouth daily.  02/01/19   [provider]  FLUoxetine (PROZAC) 10 MG capsule Take 10  mg by mouth daily. 04/12/19   [provider]  ibuprofen (ADVIL) 600 MG tablet Take 600 mg by mouth every 8 (eight) hours as needed for mild pain.  05/06/19   [provider]  risperiDONE (RISPERDAL) 2 MG tablet Take 2 mg by mouth at bedtime.    [provider]  terazosin (HYTRIN) 2 MG capsule Take 2 mg by mouth at bedtime. 01/24/19   [provider]  XARELTO 10 MG TABS tablet Take 10 mg by mouth every evening.  12/15/18   [provider]    Allergies    Other and Shellfish allergy  Review of Systems   Review of Systems  All other systems  reviewed and are negative.   Physical Exam Updated Vital Signs BP (!) 125/89 (BP Location: Right Arm)   Pulse 57   Temp 98.7 F (37.1 C) (Oral)   Resp 19   Ht 5\' 10"  (1.778 m)   Wt 83.9 kg   SpO2 100%   BMI 26.54 kg/m   Physical Exam Vitals and nursing note reviewed. Exam conducted with a chaperone present.  Constitutional:      General: He is not in acute distress.    Appearance: He is not ill-appearing.  HENT:     Head: Normocephalic and atraumatic.  Eyes:     General: No scleral icterus.    Conjunctiva/sclera: Conjunctivae normal.  Cardiovascular:     Rate and Rhythm: Normal rate and regular rhythm.     Pulses: Normal pulses.     Heart sounds: Normal heart sounds.     Comments: Peripheral pulses intact and symmetric bilaterally. Pulmonary:     Effort: Pulmonary effort is normal. No respiratory distress.     Breath sounds: Normal breath sounds. No wheezing.  Abdominal:     Comments: Soft, nondistended.  No reproducible epigastric or periumbilical TTP.  No TTP elsewhere.  No overlying skin changes.  No guarding.  NABS.  Skin:    General: Skin is dry.     Capillary Refill: Capillary refill takes less than 2 seconds.     Comments: No rashes or other overlying skin changes.  Neurological:     Mental Status: He is alert.     GCS: GCS eye subscore is 4. GCS verbal subscore is 5. GCS motor subscore is 6.     Comments: CN II through XII grossly intact.  Negative Romberg and cerebellar exams.  Grip strength intact and symmetric bilaterally.  Able to abduct right arm against resistance.  Assessed radial, ulnar, and median nerves-all intact.  Able to ambulate without ataxia or difficulty.  PERRL and EOM intact.  Answering questions appropriately.  Alert and oriented x4.  Psychiatric:        Mood and Affect: Mood normal.        Behavior: Behavior normal.        Thought Content: Thought content normal.     ED Results / Procedures / Treatments   Labs (all labs ordered are  listed, but only abnormal results are displayed) Labs Reviewed - No data to display  EKG None  Radiology No results found.  Procedures Procedures (including critical care time)  Medications Ordered in ED Medications - No data to display  ED Course  I have reviewed the triage vital signs and the nursing notes.  Pertinent labs & imaging results that were available during my care of the patient were reviewed by me and considered in my medical decision making (see chart for details).  MDM Rules/Calculators/A&P                          At time of my initial evaluation, patient states that he needs to catch a bus that leaves him 10 minutes in order to make at home to [facility].  Patient states that he does not want any laboratory work-up or imaging performed.  He suspects that perhaps it was attributed to his recent steroid injection.  My physical exam is entirely benign and he is no longer having any neuro deficits.  His abdominal exam is also benign and without any distention, rigidity, or tenderness to palpation.  He denies any fevers or chills, nausea or vomiting, diminished appetite, change in bowel habits, or other concerning history for possible abdominal pathology.  He has been taking his Xarelto, as prescribed.  While patient may have had a superficial thrombophlebitis, his reported history is more consistent with paresthesias.  This could be stemmed from his recent steroid injection, however I also suspect that he could have simply been lying awkwardly in his bed which provoked his numbness and tingling sensation that has since entirely resolved.  I have a lower suspicion for TIA or intracranial pathology at this time, despite his history of hyperlipidemia.  He denied any other associated symptoms.  However, I strongly encourage patient to follow-up with his primary care provider regarding today's encounter for further evaluation and management.  I have lower suspicion for  electrolyte derangement as cause of his symptoms given the localized nature of his paresthesias.  It could have been associated with his cervical degenerative disc disease, however there was no provoking or inciting trauma.  He describes his employment is physically demanding which could be playing a role in his joint disease and musculoskeletal issues.  However, I emphasized the importance of return to the ED immediately as paresthesias could often suggest more nefarious pathology.    Given his reassuring exam and vital signs, do not feel as though AMA is necessary despite lack of any laboratory work-up or imaging.  Patient feels significantly improved and wants to go home.  I feel as though that is reasonable given that he reiterated he did not want any work-up as he felt entirely back at baseline and was eager to return home.  Discussed with Dr. Rhunette Croft who agrees that it is reasonable given everything previously mentioned.   Patient and/or family were informed that while patient is appropriate for discharge at this time, some medical emergencies may only develop or become detectable after a period of time.  I specifically instructed patient and/or family to return to return to the ED or seek immediate medical attention for any new or worsening symptoms.  They were provided opportunity to ask any additional questions and have none at this time.  Prior to discharge patient is feeling well, agreeable with plan for discharge home.  They have expressed understanding of verbal discharge instructions as well as return precautions and are agreeable to the plan.   Final Clinical Impression(s) / ED Diagnoses Final diagnoses:  Numbness and tingling of right arm    Rx / DC Orders ED Discharge Orders         Ordered    Ambulatory referral to Physical Therapy     Discontinue  Reprint     08/05/19 1939           Lorelee New, PA-C 08/05/19 2052    Derwood Kaplan, MD 08/07/19 [phone]

## 2019-08-05 NOTE — ED Notes (Signed)
Discharge instructions discussed with pt. Pt verbalized understanding. Pt stable and ambulatory. No signature pad available. 

## 2019-08-09 ENCOUNTER — Encounter: Payer: Medicare Other | Admitting: Physical Therapy

## 2019-08-15 ENCOUNTER — Encounter: Payer: Medicare Other | Admitting: Physical Therapy

## 2019-08-17 ENCOUNTER — Encounter: Payer: Medicare Other | Admitting: Physical Therapy

## 2019-08-22 ENCOUNTER — Encounter: Payer: Medicare Other | Admitting: Physical Therapy

## 2019-08-24 ENCOUNTER — Encounter: Payer: Medicare Other | Admitting: Physical Therapy

## 2019-08-29 ENCOUNTER — Encounter: Payer: Medicare Other | Admitting: Physical Therapy

## 2019-08-31 ENCOUNTER — Encounter: Payer: Medicare Other | Admitting: Physical Therapy

## 2020-09-23 ENCOUNTER — Encounter (HOSPITAL_COMMUNITY): Payer: Self-pay

## 2020-09-23 ENCOUNTER — Other Ambulatory Visit: Payer: Self-pay

## 2020-09-23 ENCOUNTER — Emergency Department (HOSPITAL_COMMUNITY)
Admission: EM | Admit: 2020-09-23 | Discharge: 2020-09-24 | Disposition: A | Discharge status: Home / Self Care | Payer: 59 | Attending: Emergency Medicine | Admitting: Emergency Medicine

## 2020-09-23 DIAGNOSIS — H9201 Otalgia, right ear: Secondary | ICD-10-CM | POA: Insufficient documentation

## 2020-09-23 DIAGNOSIS — Z5321 Procedure and treatment not carried out due to patient leaving prior to being seen by health care provider: Secondary | ICD-10-CM | POA: Insufficient documentation

## 2020-09-23 DIAGNOSIS — M542 Cervicalgia: Secondary | ICD-10-CM | POA: Insufficient documentation

## 2020-09-23 DIAGNOSIS — J029 Acute pharyngitis, unspecified: Secondary | ICD-10-CM | POA: Diagnosis not present

## 2020-09-23 NOTE — ED Provider Notes (Signed)
Emergency Medicine Provider Triage Evaluation Note  Adrian Cantu , a 55 y.o. male  was evaluated in triage.  Pt complains of left-sided neck pain some right ear pain and neck stiffness and sore throat.  His symptoms of been going on for several months.  He denies any nausea vomiting chest pain shortness of breath lightness or dizziness  Review of Systems  Positive: Right ear pain, neck pain, hoarse voice Negative: Fever  Physical Exam  BP 118/67 (BP Location: Right Arm)   Pulse 77   Temp 98.5 F (36.9 C) (Oral)   Resp 16   SpO2 97%  Gen:   Awake, no distress   Resp:  Normal effort  MSK:   Moves extremities without difficulty  Other:    Medical Decision Making  Medically screening exam initiated at 10:03 PM.  Appropriate orders placed.  [name] was informed that the remainder of the evaluation will be completed by another provider, this initial triage assessment does not replace that evaluation, and the importance of remaining in the ED until their evaluation is complete.     Gailen Shelter, [place] 09/23/20 2206    Terrilee Files, MD 09/24/20 1014

## 2020-09-23 NOTE — ED Triage Notes (Signed)
Pt reports R ear pain, neck pain and stiffness and sore throat since June, reports that today he feels hoarse.

## 2020-09-23 NOTE — ED Notes (Signed)
PT eloped due to the wait time

## 2020-09-24 ENCOUNTER — Other Ambulatory Visit: Payer: Self-pay

## 2020-09-24 ENCOUNTER — Encounter (HOSPITAL_COMMUNITY): Payer: Self-pay | Admitting: Emergency Medicine

## 2020-09-24 ENCOUNTER — Ambulatory Visit (HOSPITAL_COMMUNITY)
Admission: EM | Admit: 2020-09-24 | Discharge: 2020-09-24 | Disposition: A | Discharge status: Home / Self Care | Payer: Medicare Other | Attending: Emergency Medicine | Admitting: Emergency Medicine

## 2020-09-24 DIAGNOSIS — R490 Dysphonia: Secondary | ICD-10-CM | POA: Diagnosis not present

## 2020-09-24 DIAGNOSIS — J029 Acute pharyngitis, unspecified: Secondary | ICD-10-CM | POA: Diagnosis not present

## 2020-09-24 MED ORDER — MELOXICAM 15 MG PO TABS: 15.0000 mg | ORAL_TABLET | Freq: Every day | ORAL | 0 refills | Status: AC

## 2020-09-24 MED ORDER — LIDOCAINE VISCOUS HCL 2 % MT SOLN: 15.0000 mL | OROMUCOSAL | 0 refills | Status: AC | PRN

## 2020-09-24 NOTE — ED Triage Notes (Signed)
Pt reports since June had left sided neck pains, sore throat causing hoarseness. Pt was at [facility] ED yesterday but left before being seen

## 2020-09-24 NOTE — Discharge Instructions (Signed)
Please follow-up with ear nose and throat specialist, information listed below please call and try to schedule appointment for reevaluation of symptoms  You may use meloxicam as needed when pain is flared up  You may use lidocaine 15 mils every 4 hours as needed, gargle and spit

## 2020-09-24 NOTE — ED Provider Notes (Addendum)
[facility]    CSN: [id number] Arrival date & time: 09/24/20  1417      History   Chief Complaint Chief Complaint  Patient presents with   Neck Pain   Sore Throat    HPI Adrian Cantu is a 55 y.o. male.   Patient presents with intermittent left-sided neck pain making it difficult to laterally rotate, intermittent sore causing worsening, and intermittent right-sided ear pain radiating down into the right side of neck occurring for 4 months.  Tolerating food and liquids.  Denies fever, chills, body aches, difficulty swallowing, pain with swallowing, itchy throat, throat swelling, nasal congestion, rhinorrhea.  Has not attempted treatment of symptoms.  Patient attests that last endoscopy/colonoscopy in 2018, normal with no concern.   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 Normal   ESOPHAGOGASTRODUODENOSCOPY  04/29/2016   [facility]. Normal       Home Medications    Prior to Admission medications   Medication Sig Start Date End Date Taking? Authorizing Provider  lidocaine (XYLOCAINE) 2 % solution Use as directed 15 mLs in the mouth or throat as needed for mouth pain. 09/24/20  Yes Courtenay Creger R, NP  meloxicam (MOBIC) 15 MG tablet Take 1 tablet (15 mg total) by mouth daily. 09/24/20  Yes Kahmya Pinkham R,  NP  atorvastatin (LIPITOR) 20 MG tablet Take 10 mg by mouth daily.  02/01/19   [provider]  FLUoxetine (PROZAC) 10 MG capsule Take 10 mg by mouth daily. 04/12/19   [provider]  ibuprofen (ADVIL) 600 MG tablet Take 600 mg by mouth every 8 (eight) hours as needed for mild pain.  05/06/19   [provider]  risperiDONE (RISPERDAL) 2 MG tablet Take 2 mg by mouth at bedtime.    [provider]  terazosin (HYTRIN) 2 MG capsule Take 2 mg by mouth at bedtime. 01/24/19   [provider]  XARELTO 10 MG TABS tablet Take 10 mg by mouth every evening.  12/15/18   [provider]    Family History Family History  Problem Relation Age of Onset   Hyperlipidemia Mother    Hypertension Mother    Diabetes Father    Diabetes Sister    Hyperlipidemia Sister    Hypertension Sister    Diabetes Brother    Colon cancer Neg Hx    Esophageal cancer Neg Hx     Social History Social History   Tobacco Use   Smoking status: Never   Smokeless tobacco: Never  Vaping Use   Vaping Use: Never used  Substance Use Topics   Alcohol use: No   Drug use: No     Allergies   Other and Shellfish allergy  Review of Systems Review of Systems  Constitutional: Negative.   HENT:  Positive for sore throat. Negative for congestion, dental problem, drooling, ear discharge, ear pain, facial swelling, hearing loss, mouth sores, nosebleeds, postnasal drip, rhinorrhea, sinus pressure, sinus pain, sneezing, tinnitus, trouble swallowing and voice change.   Musculoskeletal:  Positive for neck pain. Negative for arthralgias, back pain, gait problem, joint swelling, myalgias and neck stiffness.  Skin: Negative.     Physical Exam Triage Vital Signs ED Triage Vitals  Enc Vitals Group     BP 09/24/20 1542 113/73     Pulse Rate 09/24/20 1542 60     Resp 09/24/20 1542 16     Temp 09/24/20 1542 98.4 F (36.9 C)     Temp Source 09/24/20 1542 Oral     SpO2 09/24/20 1542  98 %     Weight --      Height --      Head Circumference --      Peak Flow --      Pain Score 09/24/20 1540 8     Pain Loc --      Pain Edu? --      Excl. in GC? --    No data found.  Updated Vital Signs BP 113/73 (BP Location: Left Arm)   Pulse 60   Temp 98.4 F (36.9 C) (Oral)   Resp 16   SpO2 98%   Visual Acuity Right Eye Distance:   Left Eye Distance:   Bilateral Distance:    Right Eye Near:   Left Eye Near:    Bilateral Near:     Physical Exam Constitutional:      Appearance: Normal appearance. He is well-developed and normal weight.  HENT:     Head: Normocephalic.     Right Ear: Tympanic membrane, ear canal and external ear normal.     Left Ear: Tympanic membrane, ear canal and external ear normal.     Nose: Nose normal.     Mouth/Throat:     Mouth: Mucous membranes are moist.     Tongue: No lesions. Tongue does not deviate from midline.     Palate: No mass and lesions.     Pharynx: Oropharynx is clear. Uvula midline. No pharyngeal swelling, oropharyngeal exudate or posterior oropharyngeal erythema.     Tonsils: No tonsillar exudate or tonsillar abscesses. 0 on the right. 0 on the left.  Eyes:     Extraocular Movements: Extraocular movements intact.  Neck:     Thyroid: No thyroid mass, thyromegaly or thyroid tenderness.     Trachea: Trachea normal.  Musculoskeletal:     Cervical back: Normal range of motion and neck supple.  Lymphadenopathy:     Cervical: No cervical adenopathy.  Neurological:     Mental Status: He is alert.  Psychiatric:        Mood and Affect: Mood normal.        Behavior: Behavior normal.     UC Treatments / Results  Labs (all labs ordered are listed, but only abnormal results are displayed) Labs Reviewed - No data to display  EKG   Radiology No results found.  Procedures Procedures (including critical care time)  Medications Ordered in UC Medications - No data to display  Initial Impression / Assessment and Plan  / UC Course  I have reviewed the triage vital signs and the nursing notes.  Pertinent labs & imaging results that were available during my care of the patient were reviewed by me  and considered in my medical decision making (see chart for details).  Sore throat Hoarseness  Vital signs stable, exam unremarkable, low suspicion of infectious cause due to timeline of symptoms, recommended follow-up with the ear nose and throat specialist for further evaluation, discussed with patient, in agreement with plan of care, given walking referral  1.  Meloxicam 15 mg daily, Discussed bleeding risks with use of Xarelto, verbalized understanding to discontinue medication for signs of increased bleeding 2.  Lidocaine viscous 2% 15 mL every 4 hours. Final Clinical Impressions(s) / UC Diagnoses   Final diagnoses:  Sore throat  Hoarseness     Discharge Instructions      Please follow-up with ear nose and throat specialist, information listed below please call and try to schedule appointment for reevaluation of symptoms  You may use meloxicam as needed when pain is flared up  You may use lidocaine 15 mils every 4 hours as needed, gargle and spit   ED Prescriptions     Medication Sig Dispense Auth. Provider   meloxicam (MOBIC) 15 MG tablet Take 1 tablet (15 mg total) by mouth daily. 30 tablet Yamili Lichtenwalner R, NP   lidocaine (XYLOCAINE) 2 % solution Use as directed 15 mLs in the mouth or throat as needed for mouth pain. 100 mL Valinda Hoar, NP      PDMP not reviewed this encounter.   Valinda Hoar, NP 09/24/20 1649    Valinda Hoar, NP 09/24/20 1650

## 2020-10-04 ENCOUNTER — Other Ambulatory Visit: Payer: Self-pay

## 2020-10-04 ENCOUNTER — Emergency Department (HOSPITAL_COMMUNITY)
Admission: EM | Admit: 2020-10-04 | Discharge: 2020-10-04 | Discharge status: Against Medical Advice | Payer: Medicare Other

## 2020-10-05 ENCOUNTER — Encounter (HOSPITAL_COMMUNITY): Payer: Self-pay

## 2020-10-05 ENCOUNTER — Other Ambulatory Visit: Payer: Self-pay

## 2020-10-05 ENCOUNTER — Emergency Department (HOSPITAL_COMMUNITY)
Admission: EM | Admit: 2020-10-05 | Discharge: 2020-10-05 | Disposition: A | Discharge status: Home / Self Care | Payer: Medicare Other | Attending: Emergency Medicine | Admitting: Emergency Medicine

## 2020-10-05 DIAGNOSIS — H9202 Otalgia, left ear: Secondary | ICD-10-CM | POA: Diagnosis present

## 2020-10-05 DIAGNOSIS — R059 Cough, unspecified: Secondary | ICD-10-CM | POA: Diagnosis not present

## 2020-10-05 DIAGNOSIS — J029 Acute pharyngitis, unspecified: Secondary | ICD-10-CM | POA: Diagnosis not present

## 2020-10-05 DIAGNOSIS — M542 Cervicalgia: Secondary | ICD-10-CM | POA: Diagnosis not present

## 2020-10-05 MED ORDER — METHOCARBAMOL 500 MG PO TABS: 500.0000 mg | ORAL_TABLET | Freq: Three times a day (TID) | ORAL | 0 refills | Status: AC | PRN

## 2020-10-05 NOTE — Discharge Instructions (Signed)
You came to the emergency department to be evaluated for your left ear pain, sore throat, trouble swallowing, and neck pain.  Your physical exam was reassuring.  I have given you prescription for Robaxin to help with your neck pain.  Please use your prescribed viscous lidocaine.  You will need to follow-up with a ear nose and throat doctor in the outpatient setting.  Please call the number listed on this paperwork to schedule a an appointment with them.  Today you were prescribed Methocarbamol (Robaxin).  Methocarbamol (Robaxin) is used to treat muscle spasms/pain.  It works by helping to relax the muscles.  Drowsiness, dizziness, lightheadedness, stomach upset, nausea/vomiting, or blurred vision may occur.  Do not drive, use machinery, or do anything that needs alertness or clear vision until you can do it safely.  Do not combine this medication with alcoholic beverages, marijuana, or other central nervous system depressants.    Get help right away if: You have difficulty breathing. You cannot swallow fluids, soft foods, or your saliva. You have increased swelling in your throat or neck. You have persistent nausea and vomiting.

## 2020-10-05 NOTE — ED Notes (Signed)
Patient left prior to receiving his discharge instructions.

## 2020-10-05 NOTE — ED Provider Notes (Signed)
[facility]-EMERGENCY DEPT Provider Note   CSN: [id number] Arrival date & time: 10/05/20  1149     History Chief Complaint  Patient presents with   Otalgia   Sore Throat    Adrian Cantu is a 55 y.o. male with a history of chronic headache, degenerative cervical disease, history of DVT (on Xarelto).  Patient presents to the emergency department with a chief complaint of left ear pain, sore throat, left-sided neck pain, cough, trouble swallowing, and hoarseness.  Patient reports that his symptoms started on 06/07/2020.  Patient reports that symptoms have been constant since then.  Patient denies any worsening of his symptoms.  Patient reports that cough is nonproductive.  Patient denies any rhinorrhea, nasal congestion, drooling, trismus, hot potato voice, sinus pain, sinus pressure, fevers, chills, hearing loss, ear discharge, unexpected weight loss, night sweats.     Otalgia Associated symptoms: cough, neck pain and sore throat   Associated symptoms: no abdominal pain, no congestion, no ear discharge, no fever, no headaches, no rash, no rhinorrhea and no vomiting   Sore Throat Pertinent negatives include no chest pain, no abdominal pain, no headaches and no shortness of breath.      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 Normal   ESOPHAGOGASTRODUODENOSCOPY  04/29/2016   [facility]. Normal       Family History  Problem Relation Age of Onset   Hyperlipidemia Mother    Hypertension Mother    Diabetes Father    Diabetes Sister    Hyperlipidemia Sister    Hypertension Sister    Diabetes Brother    Colon cancer Neg Hx    Esophageal cancer Neg Hx     Social History   Tobacco Use   Smoking status: Never   Smokeless tobacco: Never  Vaping Use   Vaping Use: Never used  Substance Use Topics   Alcohol use: No   Drug use: No    Home Medications Prior to Admission medications   Medication Sig Start Date End Date Taking? Authorizing Provider  atorvastatin (LIPITOR) 20 MG tablet Take 10 mg by mouth daily.  02/01/19   [provider]  FLUoxetine (PROZAC) 10 MG capsule Take 10 mg by mouth daily. 04/12/19   [provider]  ibuprofen (ADVIL) 600 MG tablet Take 600 mg by mouth every 8 (eight) hours as needed for mild pain.  05/06/19   [provider]  lidocaine (XYLOCAINE) 2 % solution Use as directed 15 mLs in the mouth or throat as needed for mouth pain. 09/24/20   Valinda Hoar, NP  meloxicam (MOBIC) 15 MG tablet Take 1 tablet (15 mg total) by mouth daily. 09/24/20   Valinda Hoar, NP  risperiDONE (RISPERDAL) 2 MG tablet Take 2 mg by mouth at bedtime.    [provider]  terazosin (HYTRIN) 2 MG capsule Take 2 mg by mouth at bedtime. 01/24/19   [provider]  XARELTO 10 MG TABS tablet Take 10 mg by mouth every evening.  12/15/18   [provider]    Allergies    Other and Shellfish allergy  Review of Systems   Review of Systems  Constitutional:  Negative for chills, fever and unexpected weight change.  HENT:  Positive for ear pain, sore throat and trouble swallowing. Negative for congestion, drooling, ear discharge, facial swelling, rhinorrhea, sinus pressure, sinus pain and voice change.   Eyes:  Negative for visual  disturbance.  Respiratory:  Positive for cough. Negative for shortness of breath.   Cardiovascular:  Negative for chest pain.  Gastrointestinal:  Negative for abdominal pain, nausea and vomiting.  Musculoskeletal:  Positive for neck pain. Negative for back pain and neck stiffness.  Skin:  Negative for color change and rash.  Allergic/Immunologic: Negative for immunocompromised state.  Neurological:  Negative for dizziness, syncope, light-headedness and headaches.  Psychiatric/Behavioral:  Negative for confusion.    Physical Exam Updated Vital Signs BP 127/68 (BP Location: Right Arm)   Pulse 66   Temp 98.2 F (36.8 C) (Oral)   Resp 19   SpO2 98%   Physical Exam Vitals and nursing note reviewed.  Constitutional:      General: He is not in acute distress.    Appearance: He is not ill-appearing, toxic-appearing or diaphoretic.  HENT:     Head: Normocephalic. No right periorbital erythema or left periorbital erythema.     Jaw: There is normal jaw occlusion. No trismus, tenderness, swelling, pain on movement or malocclusion.     Salivary Glands: Right salivary gland is not diffusely enlarged or tender. Left salivary gland is not diffusely enlarged or tender.     Right Ear: Tympanic membrane, ear canal and external ear normal. No mastoid tenderness.     Left Ear: Tympanic membrane, ear canal and external ear normal. No mastoid tenderness.     Mouth/Throat:     Lips: Pink. No lesions.     Mouth: Mucous membranes are moist. No injury, lacerations, oral lesions or angioedema.     Dentition: No dental abscesses.     Tongue: No lesions. Tongue does not deviate from midline.     Palate: No mass and lesions.     Pharynx: Oropharynx is clear. Uvula midline. No pharyngeal swelling, oropharyngeal exudate, posterior oropharyngeal erythema or uvula swelling.     Tonsils: No tonsillar exudate or tonsillar abscesses. 0 on the right. 0 on the left.     Comments: Patient able to handle oral secretions  without difficulty. Eyes:     General: No scleral icterus.       Right eye: No discharge.        Left eye: No discharge.  Neck:     Comments: Tenderness to left sternocleidomastoid and left trapezius muscle. Cardiovascular:     Rate and Rhythm: Normal rate.  Pulmonary:     Effort: Pulmonary effort is normal. No tachypnea, bradypnea or respiratory distress.  Musculoskeletal:     Cervical back: Full passive range of motion without pain, normal range of motion and neck supple. No edema, erythema, signs of trauma, rigidity, torticollis or crepitus. Muscular tenderness present. No pain with movement or spinous process tenderness. Normal range of motion.  Lymphadenopathy:     Cervical: No  cervical adenopathy.  Skin:    General: Skin is warm and dry.  Neurological:     General: No focal deficit present.     Mental Status: He is alert.     GCS: GCS eye subscore is 4. GCS verbal subscore is 5. GCS motor subscore is 6.  Psychiatric:        Behavior: Behavior is cooperative.    ED Results / Procedures / Treatments   Labs (all labs ordered are listed, but only abnormal results are displayed) Labs Reviewed - No data to display  EKG None  Radiology No results found.  Procedures Procedures   Medications Ordered in ED Medications - No data to display  ED Course  I have reviewed the triage vital signs and the nursing notes.  Pertinent labs & imaging results that were available during my care of the patient were reviewed by me and considered in my medical decision making (see chart for details).    MDM Rules/Calculators/A&P                           Alert 55 year old male in no acute distress, nontoxic appearing.  Presents to emergency department with chief complaint of left ear pain, sore throat, left-sided neck pain, nonproductive cough, trouble swallowing, hoarse voice.  Symptoms have been present since 06/07/2020.  Patient denies any progression of his symptoms over this  time.  Per chart review patient was seen at urgent care on 9/19.  Patient was prescribed with viscous lidocaine and told to follow-up with ENT in outpatient setting.  Patient has no signs of ear infection.  No mastoid tenderness.  Low suspicion for acute otitis media or otitis externa.  Patient has no erythema, swelling, or exudate to tonsils or oropharynx.  No suspicion for acute pharyngitis at this time.  Patient has no signs of peritonsillar abscess.  No swelling to oral mucosa or patient's neck; low sufficient for Ludwick's angina at this time.  Patient able to handle oral secretions without difficulty.  Patient speaks in full clear senses without difficulty.  Patient has full range of motion to neck.  No pain with passive range of motion of neck.  No midline tenderness or deformity to cervical neck.  Patient denies any recent falls or injuries.  Low suspicion for cervical injury at this time.  Will prescribe patient with Robaxin for his pain to sternocleidomastoid and trapezius muscle.  Patient advised to follow-up with ear nose and throat doctor for his longstanding sore throat.  Discussed results, findings, treatment and follow up. Patient advised of return precautions. Patient verbalized understanding and agreed with plan.   Final Clinical Impression(s) / ED Diagnoses Final diagnoses:  None    Rx / DC Orders ED Discharge Orders     None        Berneice Heinrich 10/05/20 1329    Pollyann Savoy, MD 10/05/20 [phone]

## 2020-10-05 NOTE — ED Triage Notes (Signed)
Pt arrived via POV, states earache and and sore throat since June. Seen at [facility] urgent care yesterday for same.

## 2020-11-14 ENCOUNTER — Emergency Department (HOSPITAL_COMMUNITY)
Admission: EM | Admit: 2020-11-14 | Discharge: 2020-11-15 | Disposition: A | Discharge status: Home / Self Care | Payer: Medicare Other | Attending: Emergency Medicine | Admitting: Emergency Medicine

## 2020-11-14 ENCOUNTER — Other Ambulatory Visit: Payer: Self-pay

## 2020-11-14 ENCOUNTER — Encounter (HOSPITAL_COMMUNITY): Payer: Self-pay

## 2020-11-14 DIAGNOSIS — H6693 Otitis media, unspecified, bilateral: Secondary | ICD-10-CM | POA: Insufficient documentation

## 2020-11-14 DIAGNOSIS — Z7901 Long term (current) use of anticoagulants: Secondary | ICD-10-CM | POA: Insufficient documentation

## 2020-11-14 DIAGNOSIS — H9203 Otalgia, bilateral: Secondary | ICD-10-CM | POA: Diagnosis present

## 2020-11-14 DIAGNOSIS — H6091 Unspecified otitis externa, right ear: Secondary | ICD-10-CM | POA: Diagnosis not present

## 2020-11-14 DIAGNOSIS — H60391 Other infective otitis externa, right ear: Secondary | ICD-10-CM

## 2020-11-14 NOTE — ED Triage Notes (Signed)
Pt reports right ear pain/ pressure x 2 days. Placed ear drops but pain worsened. Use q-tip after that and noticed small amount of blood.

## 2020-11-15 MED ORDER — AMOXICILLIN 500 MG PO CAPS: 500.0000 mg | ORAL_CAPSULE | Freq: Three times a day (TID) | ORAL | 0 refills | Status: AC

## 2020-11-15 MED ORDER — CIPROFLOXACIN-DEXAMETHASONE 0.3-0.1 % OT SUSP: 4.0000 [drp] | Freq: Two times a day (BID) | OTIC | 0 refills | Status: AC

## 2020-11-15 NOTE — ED Provider Notes (Signed)
[facility]-EMERGENCY DEPT Provider Note   CSN: [id number] Arrival date & time: 11/14/20  2217     History Chief Complaint  Patient presents with   Ear Pain    Adrian Cantu is a 55 y.o. male.  The history is provided by the patient and medical records.   55 year old male with history of DVT on Xarelto, presenting to the ED with bilateral ear pain.  Has been ongoing for about 2 days, right ear is worse than left.  States today hearing started to sound muffled in the right ear.  He has not had any fever.  He did try using some over-the-counter eardrops but pain worsened.  Did try cleaning out his ear with a Q-tip as well and noticed a small amount of blood.  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. Normal   ESOPHAGOGASTRODUODENOSCOPY  04/29/2016   [facility]. Normal       Family History  Problem Relation Age of Onset   Hyperlipidemia Mother    Hypertension Mother    Diabetes Father    Diabetes Sister    Hyperlipidemia Sister    Hypertension Sister    Diabetes Brother    Colon cancer Neg Hx    Esophageal cancer Neg Hx     Social History   Tobacco Use   Smoking status: Never   Smokeless tobacco: Never  Vaping Use   Vaping Use: Never used  Substance Use Topics   Alcohol use: No   Drug  use: No    Home Medications Prior to Admission medications   Medication Sig Start Date End Date Taking? Authorizing Provider  amoxicillin (AMOXIL) 500 MG capsule Take 1 capsule (500 mg total) by mouth 3 (three) times daily. 11/15/20  Yes Garlon Hatchet, PA-C  ciprofloxacin-dexamethasone (CIPRODEX) OTIC suspension Place 4 drops into the right ear 2 (two) times daily. 11/15/20  Yes Garlon Hatchet, PA-C  atorvastatin (LIPITOR) 20 MG tablet Take 10 mg by mouth daily.  02/01/19   [provider]  FLUoxetine (PROZAC) 10 MG capsule Take 10 mg by mouth daily. 04/12/19   [provider]  ibuprofen (ADVIL) 600 MG tablet Take 600 mg by mouth every 8 (eight) hours as needed for mild pain.  05/06/19   [provider]  lidocaine (XYLOCAINE) 2 % solution Use as directed 15 mLs in the mouth or throat as needed for mouth pain. 09/24/20   Valinda Hoar, NP  meloxicam (MOBIC) 15 MG tablet Take 1 tablet (15 mg total) by mouth daily. 09/24/20   White, Elita Boone, NP  methocarbamol (ROBAXIN) 500 MG tablet Take 1 tablet (500 mg total) by mouth every 8 (eight) hours as needed for muscle spasms. 10/05/20  Haskel Schroeder, PA-C  risperiDONE (RISPERDAL) 2 MG tablet Take 2 mg by mouth at bedtime.    [provider]  terazosin (HYTRIN) 2 MG capsule Take 2 mg by mouth at bedtime. 01/24/19   [provider]  XARELTO 10 MG TABS tablet Take 10 mg by mouth every evening.  12/15/18   [provider]    Allergies    Other, Shellfish allergy, Apple, and Nutra balance diabetic-fiber [nutritional supplements]  Review of Systems   Review of Systems  HENT:  Positive for ear pain.   All other systems reviewed and are negative.  Physical Exam Updated Vital Signs BP 127/76 (BP Location: Left Arm)   Pulse 76   Temp 98.6 F (37 C) (Oral)   Resp 18   Ht 5\' 10"  (1.778 m)   Wt 83.9 kg   SpO2 99%   BMI 26.54 kg/m   Physical Exam Vitals and nursing note reviewed.   Constitutional:      Appearance: He is well-developed.  HENT:     Head: Normocephalic and atraumatic.     Ears:     Comments: Right EAC is swollen, left is normal Bilateral TM's erythematous with effusion noted, no signs of TM rupture, no mastoid tenderness Eyes:     Conjunctiva/sclera: Conjunctivae normal.     Pupils: Pupils are equal, round, and reactive to light.  Cardiovascular:     Rate and Rhythm: Normal rate and regular rhythm.     Heart sounds: Normal heart sounds.  Pulmonary:     Effort: Pulmonary effort is normal.     Breath sounds: Normal breath sounds.  Abdominal:     General: Bowel sounds are normal.     Palpations: Abdomen is soft.  Musculoskeletal:        General: Normal range of motion.     Cervical back: Normal range of motion.  Skin:    General: Skin is warm and dry.  Neurological:     Mental Status: He is alert and oriented to person, place, and time.    ED Results / Procedures / Treatments   Labs (all labs ordered are listed, but only abnormal results are displayed) Labs Reviewed - No data to display  EKG None  Radiology No results found.  Procedures Procedures   Medications Ordered in ED Medications - No data to display  ED Course  I have reviewed the triage vital signs and the nursing notes.  Pertinent labs & imaging results that were available during my care of the patient were reviewed by me and considered in my medical decision making (see chart for details).    MDM Rules/Calculators/A&P                           55 y.o. M here with bilateral ear pain.  Has evidence of bilateral OM along with right OE.  No signs of TM rupture or mastoiditis.  Will start on amoxicillin with ciprodex drops.  Given ENT follow-up if needed.  Return here for new concerns.  Final Clinical Impression(s) / ED Diagnoses Final diagnoses:  Bilateral otitis media, unspecified otitis media type  Infectious otitis externa, right    Rx / DC Orders ED  Discharge Orders          Ordered    amoxicillin (AMOXIL) 500 MG capsule  3 times daily        11/15/20 0040    ciprofloxacin-dexamethasone (CIPRODEX) OTIC suspension  2 times  daily        11/15/20 0040             Garlon Hatchet, PA-C 11/15/20 0041    Nira Conn, MD 11/21/20 [phone]

## 2020-11-15 NOTE — Discharge Instructions (Signed)
Take the prescribed medication as directed. Follow-up with ENT if ongoing issues-- call for appt. Return to the ED for new or worsening symptoms.

## 2021-01-02 ENCOUNTER — Emergency Department (HOSPITAL_COMMUNITY)
Admission: EM | Admit: 2021-01-02 | Discharge: 2021-01-02 | Discharge status: Against Medical Advice | Payer: BLUE CROSS/BLUE SHIELD

## 2021-01-02 NOTE — ED Notes (Signed)
PT decided to leave 5:26 pm

## 2021-05-24 ENCOUNTER — Emergency Department
Admission: EM | Admit: 2021-05-24 | Discharge: 2021-05-24 | Disposition: A | Discharge status: Home / Self Care | Payer: BC Managed Care – PPO | Attending: Emergency Medicine | Admitting: Emergency Medicine

## 2021-05-24 DIAGNOSIS — M545 Low back pain, unspecified: Secondary | ICD-10-CM | POA: Insufficient documentation

## 2021-05-24 HISTORY — DX: Acute embolism and thrombosis of unspecified deep veins of unspecified lower extremity: I82.409

## 2021-05-24 HISTORY — DX: Hyperlipidemia, unspecified: E78.5

## 2021-05-24 HISTORY — DX: Other pulmonary embolism without acute cor pulmonale: I26.99

## 2021-05-24 LAB — BASIC METABOLIC PANEL
Anion Gap: 8 (ref 5.0–15.0)
BUN: 14 mg/dL (ref 9.0–28.0)
CO2: 27 mEq/L (ref 17–29)
Calcium: 8.8 mg/dL (ref 8.5–10.5)
Chloride: 109 mEq/L (ref 99–111)
Creatinine: 1.1 mg/dL (ref 0.5–1.5)
Glucose: 89 mg/dL (ref 70–100)
Potassium: 4.1 mEq/L (ref 3.5–5.3)
Sodium: 144 mEq/L (ref 135–145)
eGFR: >60 mL/min/{1.73_m2} (ref >=60)

## 2021-05-24 MED ORDER — METHYLPREDNISOLONE 4 MG PO TBPK
ORAL_TABLET | ORAL | 0 refills | Status: DC
Start: 2021-05-24 — End: 2021-05-24

## 2021-05-24 MED ORDER — METHYLPREDNISOLONE 4 MG PO TBPK
ORAL_TABLET | ORAL | 0 refills | Status: AC
Start: 2021-05-24 — End: ?

## 2021-05-24 MED ORDER — DICLOFENAC SODIUM 1 % EX GEL
4.0000 g | Freq: Four times a day (QID) | CUTANEOUS | 1 refills | Status: DC
Start: 2021-05-24 — End: 2021-05-24

## 2021-05-24 MED ORDER — METHOCARBAMOL 500 MG PO TABS
500.0000 mg | ORAL_TABLET | Freq: Four times a day (QID) | ORAL | 0 refills | Status: DC
Start: 2021-05-24 — End: 2021-05-24

## 2021-05-24 MED ORDER — HYDROCODONE-ACETAMINOPHEN 5-325 MG PO TABS
1.0000 | ORAL_TABLET | Freq: Four times a day (QID) | ORAL | 0 refills | Status: AC | PRN
Start: 2021-05-24 — End: ?

## 2021-05-24 MED ORDER — ACETAMINOPHEN 500 MG PO TABS
1000.0000 mg | ORAL_TABLET | Freq: Once | ORAL | Status: AC
Start: 2021-05-24 — End: 2021-05-24
  Administered 2021-05-24: 1000 mg via ORAL
  Filled 2021-05-24: qty 2

## 2021-05-24 MED ORDER — DICLOFENAC SODIUM 1 % EX GEL
4.0000 g | Freq: Four times a day (QID) | CUTANEOUS | 1 refills | Status: AC
Start: 2021-05-24 — End: ?

## 2021-05-24 MED ORDER — METHOCARBAMOL 500 MG PO TABS
500.0000 mg | ORAL_TABLET | Freq: Four times a day (QID) | ORAL | 0 refills | Status: AC
Start: 2021-05-24 — End: ?

## 2021-05-24 NOTE — ED Triage Notes (Signed)
Patient came in due to back pain started 10 days ago. He went to the urgent care and prescribed with naproxen however no relief, the persistence and worsening of pain opted to sought ed consult.

## 2021-05-24 NOTE — ED Provider Notes (Signed)
[facility] EMERGENCY DEPARTMENT HISTORY AND PHYSICAL      Visit Date: 05/24/2021    CLINICAL SUMMARY         Diagnosis:    .     Final diagnoses:   None         MDM Notes:    Initial Assessment/Differential:  Arthur Wells is a 56 y.o. 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 Vital signs grossly within normal limits. Physical exam as documented below.     Assessment:  Differential diagnosis to include but not limited to: MSK sprain or tension, slipped lumbar disk however without radiculopathy.  Lower suspicion for: Malignancy, acute spine infection, cord compression, spinal fracture, kidney injury or stone.     Plan:   BMP wnl   Tyelnol '1000mg'$  x1 in the ED  Referral placed for PCM and [facility] to get PT  Rxed Volteron Gel, robaxin, and medrol pack as well as short course opoid for night time pain.   Advised patient to stop Flexeril and Naproxen, cautioned on driving with medications, and provided return precautions.     Final Impression (after results):    MSK back pain without Red flags or radiculopathy. Non urgent evaluation with PT and [facility] recommended to patient as well as short course of symptom management. Return precautions given, however likely uncomplicated back pain.  Pt would benefit from PT through [facility].   The patient was deemed stable for discharge. They were given strict return precautions as it relates to their presumed diagnosis, verbalized understanding of these precautions and agreed to follow up as instructed. All questions were answered prior to discharge.  This patient was seen and evaluated during the SARS-CoV-2 pandemic of 2020.     The following personal protective equipment was used by me in the care of this patient: disposable surgical mask.           Disposition:      Discharge         Discharge  Prescriptions       Medication Sig Dispense Auth. Provider    methylPREDNISolone (MEDROL DOSEPAK) 4 MG tablet  (Status: Discontinued) Use as directed 21 tablet Elberta Fortis, MD    diclofenac Sodium (Voltaren) 1 % Gel topical gel  (Status: Discontinued) Apply 4 g topically 4 (four) times daily 3 g Elberta Fortis, MD    methocarbamol (ROBAXIN) 500 MG tablet  (Status: Discontinued) Take 1 tablet (500 mg) by mouth 4 (four) times daily 30 tablet Elberta Fortis, MD    HYDROcodone-acetaminophen (NORCO) 5-325 MG per tablet Take 1 tablet by mouth every 6 (six) hours as needed for Pain Take with fiber to avoid constipation 5 tablet Ambrose Finland, MD    diclofenac Sodium (Voltaren) 1 % Gel topical gel Apply 4 g topically 4 (four) times daily 3 g Elberta Fortis, MD    methocarbamol (ROBAXIN) 500 MG tablet Take 1 tablet (500 mg) by mouth 4 (four) times daily 30 tablet Elberta Fortis, MD    methylPREDNISolone (MEDROL DOSEPAK) 4 MG tablet Use as directed 21 tablet Elberta Fortis, MD                        CLINICAL INFORMATION      Chief Complaint: Hip Pain  and Back Pain         HPI:      Hip Pain  Associated symptoms include myalgias. Pertinent negatives include no abdominal pain, arthralgias, chest pain, chills, fatigue, fever, headaches, joint swelling, nausea, neck pain, numbness, rash, vomiting or weakness.   Back Pain  Associated symptoms: no abdominal pain, no chest pain, no dysuria, no fever, no headaches, no numbness and no weakness      Arthur Wells is a 56 y.o. 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  The pain is described as persistent and sharp worse with movement and has made him have to take time off work. Went to urgent care on Monday who gave him naproxen and flexeril which he reports did not help him much. No red flag symptoms  including numbness, tingling, radiation pain down the leg, bowel or bladder incontinence. Worried about his kidneys and think it may be due to his flomax which he takes for BPH. Came into the ED for further evaluation in hopes to figure out what is going on. Reports the Flexeril makes him sleepy.          ROS:    Review of Systems   Constitutional:  Positive for activity change. Negative for appetite change, chills, fatigue and fever.   Respiratory:  Negative for shortness of breath.    Cardiovascular:  Negative for chest pain, palpitations and leg swelling.   Gastrointestinal:  Negative for abdominal pain, blood in stool, constipation, diarrhea, nausea, rectal pain and vomiting.   Genitourinary:  Negative for decreased urine volume, difficulty urinating, dysuria, enuresis, flank pain, frequency, hematuria and urgency.   Musculoskeletal:  Positive for back pain, gait problem and myalgias. Negative for arthralgias, joint swelling, neck pain and neck stiffness.   Skin:  Negative for color change, rash and wound.   Neurological:  Negative for tremors, weakness, light-headedness, numbness and headaches.   Hematological:  Negative for adenopathy.   Psychiatric/Behavioral:  Negative for agitation and behavioral problems.        All other systems reviewed and negative except for what is documented above.        Physical Exam:      Pulse 67  BP 131/80  Resp 18  SpO2 100 %  Temp 98.3 F (36.8 C)    Physical Exam  Constitutional:       Appearance: Normal appearance.   HENT:      Head: Normocephalic and atraumatic.   Eyes:      Conjunctiva/sclera: Conjunctivae normal.   Cardiovascular:      Rate and Rhythm: Normal rate and regular rhythm.   Pulmonary:      Effort: Pulmonary effort is normal.      Breath sounds: Normal breath sounds.   Abdominal:      General: Abdomen is flat.      Palpations: Abdomen is soft.   Musculoskeletal:      Cervical back: Normal range of motion.      Right lower leg: No edema.      Left lower  leg: No edema.      Comments: No focal tenderness of site of pain in right back midline or gluteal area. Ambulitory with some pain. Spine with full range of motion. No shooting pain with movement down legs.  Skin:     General: Skin is warm and dry.   Neurological:      General: No focal deficit present.      Mental Status: He is alert and oriented to person, place, and time.      Cranial Nerves: No cranial nerve deficit.      Motor: No weakness.      Gait: Gait normal.      Comments: Gait overall unremarkable but visably in pain when walking.    Psychiatric:         Mood and Affect: Mood normal.         Behavior: Behavior normal.                  PAST HISTORY        Primary Care Provider: No primary care provider on file.        PMH/PSH:    .     Past Medical History:   Diagnosis Date    DVT (deep venous thrombosis)     Hyperlipidemia     Pulmonary embolism        He has no past surgical history on file.      Social/Family History:      He reports that he has never smoked. He does not have any smokeless tobacco history on file. He reports that he does not drink alcohol and does not use drugs.    History reviewed. No pertinent family history.      Listed Medications on Arrival:    .     Home Medications               atorvastatin (LIPITOR) 20 MG tablet     Take 1 tablet (20 mg) by mouth daily     cyclobenzaprine (FLEXERIL) 10 MG tablet     Take 1 tablet (10 mg) by mouth 3 (three) times daily as needed for Muscle spasms     naproxen (NAPROSYN) 500 MG tablet     Take 1 tablet (500 mg) by mouth 2 (two) times daily with meals     rivaroxaban (XARELTO) 10 MG Tab     Take by mouth daily with dinner     terazosin (HYTRIN) 2 MG capsule     Take 1 capsule (2 mg) by mouth nightly           Allergies: He is allergic to shellfish-derived products.            VISIT INFORMATION        Clinical Course in the ED:        ED Course as of 05/24/21 0737   Fri May 24, 2021   [id number] I performed a bedside ultrasound that showed normal  caliber aorta.  No free intra-abdominal fluid. [SM]      ED Course User Index  [SM] Denny Levy     Vitals:    05/24/21 0532   BP: 131/80   Pulse: 67   Resp: 18   Temp: 98.3 F (36.8 C)   TempSrc: Skin   SpO2: 100%   Weight: 79.4 kg   Height: '5\' 10"'$  (1.778 m)       Throughout the patient's stay in the Emergency Department, questions and concerns surrounding pain management, care plan, diagnostic studies, effects of medications administered or prescribed, and future care plan were assessed and addressed.         Medications Given in the ED:    .  ED Medication Orders (From admission, onward)      Start Ordered     Status Ordering Provider    05/24/21 0622 05/24/21 [id number]  acetaminophen (TYLENOL) tablet 1,000 mg  Once        Route: Oral  Ordered Dose: 1,000 mg       Ordered Marijo Quizon, Othello N              Procedures:      Procedures      Interpretations:                   RESULTS        Lab Results:      Results       ** No results found for the last 24 hours. **                Radiology Results:      No orders to display                 ATTESTATIONS AND NOTES        Scribe Attestation:      There was no scribe involved in the care of this patient.       Documentation Notes:    .     Parts of this note were generated by the Epic EMR system/ Dragon speech recognition and may contain inherent errors or omissions not intended by the user. Grammatical errors, random word insertions, deletions, pronoun errors and incomplete sentences are occasional consequences of this technology due to software limitations. Not all errors are caught or corrected.    My documentation is often completed after the patient is no longer under my clinical care. In some cases, the Epic EMR may pull updated results into the above documentation which may not reflect all results or information that was available to me at the time of my medical decision making.     If there are questions or concerns about the content of this note or information  contained within the body of this dictation they should be addressed directly with the author for clarification.          Elberta Fortis, MD  Resident  05/24/21 [phone]

## 2021-05-24 NOTE — Discharge Instructions (Addendum)
Dear Arthur Wells:    Thank you for choosing the [facility] Emergency Department, the premier emergency department in the [place] area.  I hope your visit today was EXCELLENT. You will receive a survey via text message that will give you the opportunity to provide feedback to your team about your visit. Please do not hesitate to reach out with any questions!    Specific instructions for your visit today:    DO NOT DRIVE OR OPERATE ANY OTHER HEAVY MACHINERY WHILE ON THE MUSCLE RELAXER    IF YOU DO NOT CONTINUE TO IMPROVE OR YOUR CONDITION WORSENS, PLEASE CONTACT YOUR DOCTOR OR RETURN IMMEDIATELY TO THE EMERGENCY DEPARTMENT.    Sincerely,  Ambrose Finland, MD  Attending Emergency Physician  [facility] Emergency Department      OBTAINING A PRIMARY CARE APPOINTMENT    Primary care physicians (PCPs, also known as primary care doctors) are either internists or family medicine doctors. Both types of PCPs focus on health promotion, disease prevention, patient education and counseling, and treatment of acute and chronic medical conditions.    If you need a primary care doctor, please call the below number and ask who is receiving new patients.     [facility]  Telephone:  [phone]  [email or web address]    DOCTOR REFERRALS  Call [phone] (available 24 hours a day, 7 days a week) if you need any further referrals and we can help you find a primary care doctor or specialist.  Also, available online at:  [email or web address]    YOUR CONTACT INFORMATION  Before leaving please check with registration to make sure we have an up-to-date contact number.  You can call registration at [phone] to update your information.  For questions about your hospital bill, please call [phone].  For questions about your Emergency Dept Physician bill please call [phone].      [place]  If you need help with health or social services, please call 2-1-1 for a free  referral to resources in your area.  2-1-1 is a free service connecting people with information on health insurance, free clinics, pregnancy, mental health, dental care, food assistance, housing, and substance abuse counseling.  Also, available online at:  http://www.[place].org    ORTHOPEDIC INJURY   Please know that significant injuries can exist even when an initial x-ray is read as normal or negative.  This can occur because some fractures (broken bones) are not initially visible on x-rays.  For this reason, close outpatient follow-up with your primary care doctor or bone specialist (orthopedist) is required.    MEDICATIONS AND FOLLOWUP  Please be aware that some prescription medications can cause drowsiness.  Use caution when driving or operating machinery.    The examination and treatment you have received in our Emergency Department is provided on an emergency basis, and is not intended to be a substitute for your primary care physician.  It is important that your doctor checks you again and that you report any new or remaining problems at that time.      ASSISTANCE WITH INSURANCE    Affordable Care Act  [facility])  Call to start or finish an application, compare plans, enroll or ask a question.  [place]: [phone]  Web:  Healthcare.gov    Help Enrolling in [place]  5082612434 (TOLL-FREE)  754-752-4624 (TTY)  Web:  Http://www.coverva.org    Local Help Enrolling in the [place]  (  571) Y287860 (MAIN)  Email:  health-help_0 .org  Web:  http://lewis-perez.info/  Address:  [address] [place], [place] [address]    SEDATING MEDICATIONS  Sedating medications include strong pain medications (e.g. narcotics), muscle relaxers, benzodiazepines (used for anxiety and as muscle relaxers), Benadryl/diphenhydramine and other antihistamines for allergic reactions/itching, and other medications.  If you are unsure if you have received a sedating  medication, please ask your physician or nurse.  If you received a sedating medication: DO NOT drive a car. DO NOT operate machinery. DO NOT perform jobs where you need to be alert.  DO NOT drink alcoholic beverages while taking this medicine.     If you get dizzy, sit or lie down at the first signs. Be careful going up and down stairs.  Be extra careful to prevent falls.     Never give this medicine to others.     Keep this medicine out of reach of children.     Do not take or save old medicines. Throw them away when outdated.     Keep all medicines in a cool, dry place. DO NOT keep them in your bathroom medicine cabinet or in a cabinet above the stove.    MEDICATION REFILLS  Please be aware that we cannot refill any prescriptions through the ER. If you need further treatment from what is provided at your ER visit, please follow up with your primary care doctor or your pain management specialist.    [place]  Did you know [facility] has two freestanding ERs located just a few miles away?  [facility] [place] [facility] [place] have short wait times, easy free parking directly in front of the building and top patient satisfaction scores - and the same Board Certified Emergency Medicine doctors as [facility].

## 2021-05-24 NOTE — ED Provider Notes (Signed)
[facility] ATTESTATION NOTE     Patient Name: Arthur Wells, Arthur Wells  Encounter Date:  05/24/2021  Attending Physician: Ambrose Finland, MD   Room:  N 26/N 26  Patient DOB:  03-12-1965  Age: 56 y.o. male  MRN:  [id number]  PCP: No primary care provider on file.         Diagnosis/Disposition:     Final Impression  Final diagnoses:   Acute right-sided low back pain without sciatica     Disposition  ED Disposition       ED Disposition   Discharge    Condition   --    Date/Time   Fri May 24, 2021  7:26 AM    Comment   [place] discharge to home/self care.    Condition at disposition: Stable               Follow up  [facility]  [place]  [phone]  Call today      [facility]  [address] [place]  [phone]  Schedule an appointment as soon as possible for a visit today      Prescriptions  Discharge Medication List as of 05/24/2021  7:35 AM        START taking these medications    Details   HYDROcodone-acetaminophen (NORCO) 5-325 MG per tablet Take 1 tablet by mouth every 6 (six) hours as needed for Pain Take with fiber to avoid constipation, Starting Fri 05/24/2021, E-Rx                   MDM:        Nursing Triage note:      Initial Assessment and Differential Diagnosis: Doubt surgical emergency.  Doubt infection.  Likely musculoskeletal strain.    Arthur Wells is a 56 y.o. male w/PMH of hyperlipidemia, pulmonary embolism on Xarelto since 2018, and BPH who is presenting with 10 days of persisting right-sided back and hip pain rated 10 out of 10.  The pain is exacerbated with movement and bending of his back.  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  He was seen in urgent care 4 days and was prescribed naproxen 2 times a day and Flexeril.  He has been taking his medications as prescribed as well  as applying Bengay, IcyHot, and Lidoderm patch with no relief of symptoms, prompting visit here.  Notes that he sleeps on a soft bed on his back.  Denies that sleeping in bed exacerbates his pain.     Patient is denying any bowel or urinary incontinence.  Denies numbness or tingling of his extremities, fevers, or chills.  Denies that the pain radiates down to his legs.  Denies any history of kidney stones.  Denies IV drug use.  Denies any known history of diabetes.  Denies history of previous back pain or back injuries but states he had a history of right rotator cuff injury that was improved with steroids.  He denies having a PCP at this time but states he does have health insurance.    Exam:  Physical Exam  Alert, oriented. RRR, no mgr. CTA B, no resp distress. Abd soft, nontender. Grossly non-focal neuro exam.  No flank pain. Tenderness to right paraspinal muscles w spasm.  Negative straight leg raise.  Full  range of motion of both hips.      Initial Differential Diagnosis:  Doubt surgical emergency.  Doubt infection.  Low utility for imaging, doubt occult fracture.  Consider renal process.  Likely musculoskeletal strain.    Plan:  Labs, pain control, outpatient follow up.    Final Impression:  56 year old male with back pain.  No symptoms consistent with emergent surgical spinal process.  Likely musculoskeletal back pain.  Failing Naprosyn and Flexeril.  We will switch to steroids and topical anti-inflammatory.  We will also add different muscle relaxants and rescue opioid for nighttime.  I discussed with this patient not driving or operating heavy machinery on any medication and that might cause sedation which includes opioids and muscle relaxants.  Works as a Recruitment consultant which likely exacerbates.  Doubt serious process such as AAA.  Renal function normal. Doubt kidney stone.  Appropriate for outpatient management.  I discussed with patient the need for outpatient management to be reevaluated for possible  imaging at a future time.  The patient was deemed stable for discharge. They were given strict return precautions as it relates to their presumed diagnosis, verbalized understanding of these precautions and agreed to follow up as instructed. All questions were answered prior to discharge.    ED Course as of 05/25/21 1624   Fri May 24, 2021   [id number] I performed a bedside ultrasound that showed normal caliber aorta.  No free intra-abdominal fluid. [SM]      ED Course User Index  [SM] Moise, Ramona Decision Making  Risk  Prescription drug management.      Records Reviewed (internal and external)?: N/A  Was management discussed with a consultant?: N/A  Diagnostic test considered and not performed: N/A  Prescription medications considered and not given: N/A  Hospitalization considered but not done: N/A  Was the decision around the need for surgery discussed with a consultant?: N/A  Social Determinants of Health Considerations: N/A  Was there decision to not resuscitate or to de-escalate care due to poor prognosis?: N/A         Interpretations, Clinical Decision Tools and Critical Care:     O2 Sat:  The patient's oxygen saturation was 100 % on room air. This was independently interpreted by me as Normal.     MIPS 2023:  Avoidance of Opioids for Low Back Pain and Headache, Age >18: Pain refractory to other medication previously prescribed         Procedures:   Procedures        ATTESTATIONS     Scribe Attestation: I am the first provider for this patient and I personally performed the services documented. Sorayah Risa Grill is scribing for me on this chart. This note and the patient instructions accurately reflect work and decisions made by me.      Documentation Notes:  Parts of this note were generated by the Epic EMR system/ Dragon speech recognition and may contain inherent errors or omissions not intended by the user. Grammatical errors, random word insertions, deletions, pronoun errors and incomplete  sentences are occasional consequences of this technology due to software limitations. Not all errors are caught or corrected.  My documentation is often completed after the patient is no longer under my clinical care. In some cases, the Epic EMR may pull updated results into the above documentation which may not reflect all results or information that were available to me at the time of my medical decision making.  If there are questions or concerns about the content of this note or information contained within the body of this dictation they should be addressed directly with the author for clarification.    Midlevel Provider Attestation:   The patient was seen and examined by the mid-level (resident, physician assistant or nurse practitioner) and the plan of care was discussed with me. I agree with the plan as it was presented to me.  I have reviewed and agree with the final ED diagnosis.   Please see the separately documented midlevel note for additional information including but not limited to full history of present illness, review of systems and comprehensive physical exam.                      Ambrose Finland, MD  05/25/21 [phone]

## 2021-06-22 IMAGING — CR DG SHOULDER 2+V*R*
3 series · 3 of 3 positions shown · non-contrast
Comparison: None.

CLINICAL DATA: Pain for approximately 1 month

EXAM:
RIGHT SHOULDER - 2+ VIEW

[shoulder y view]
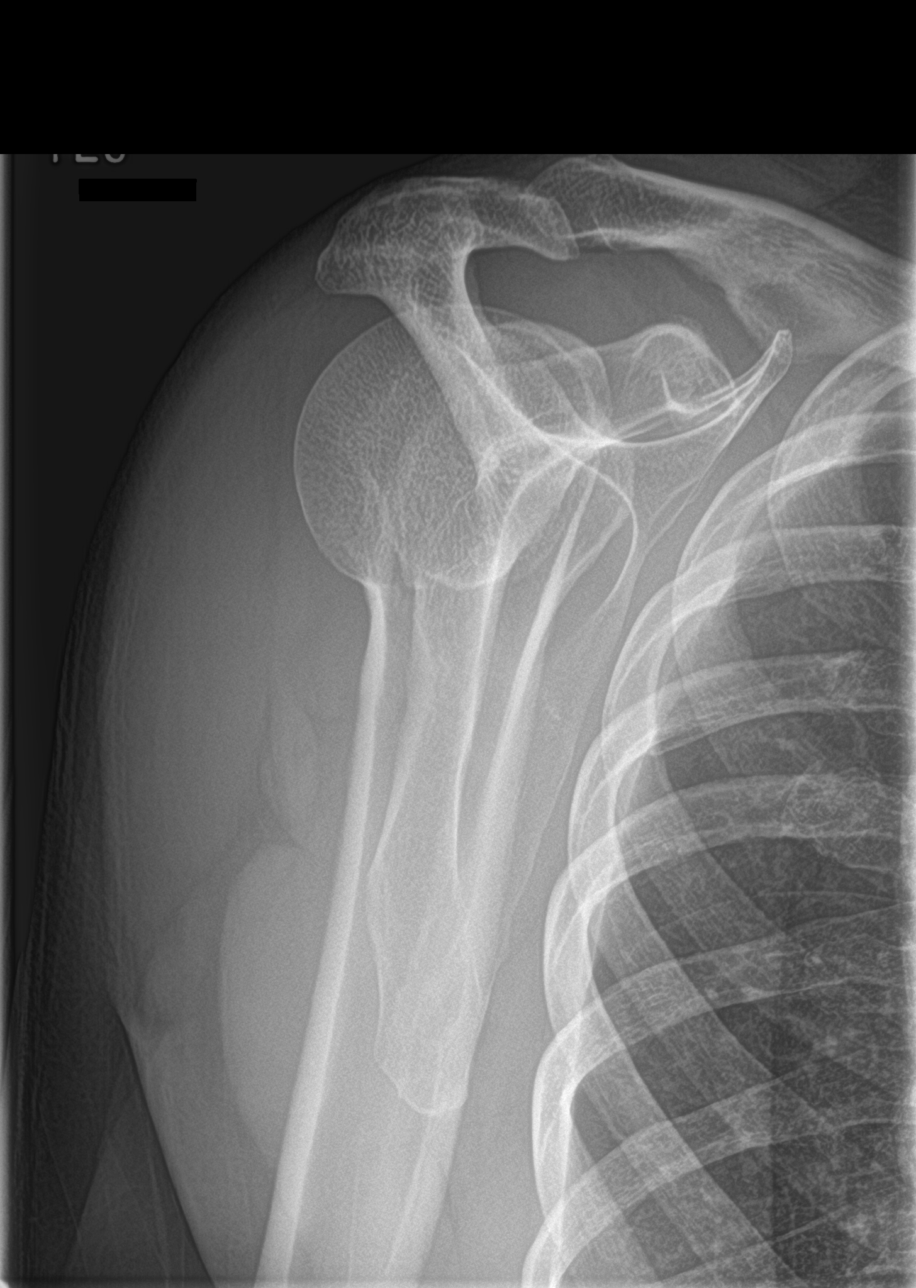

[shoulder axillary]
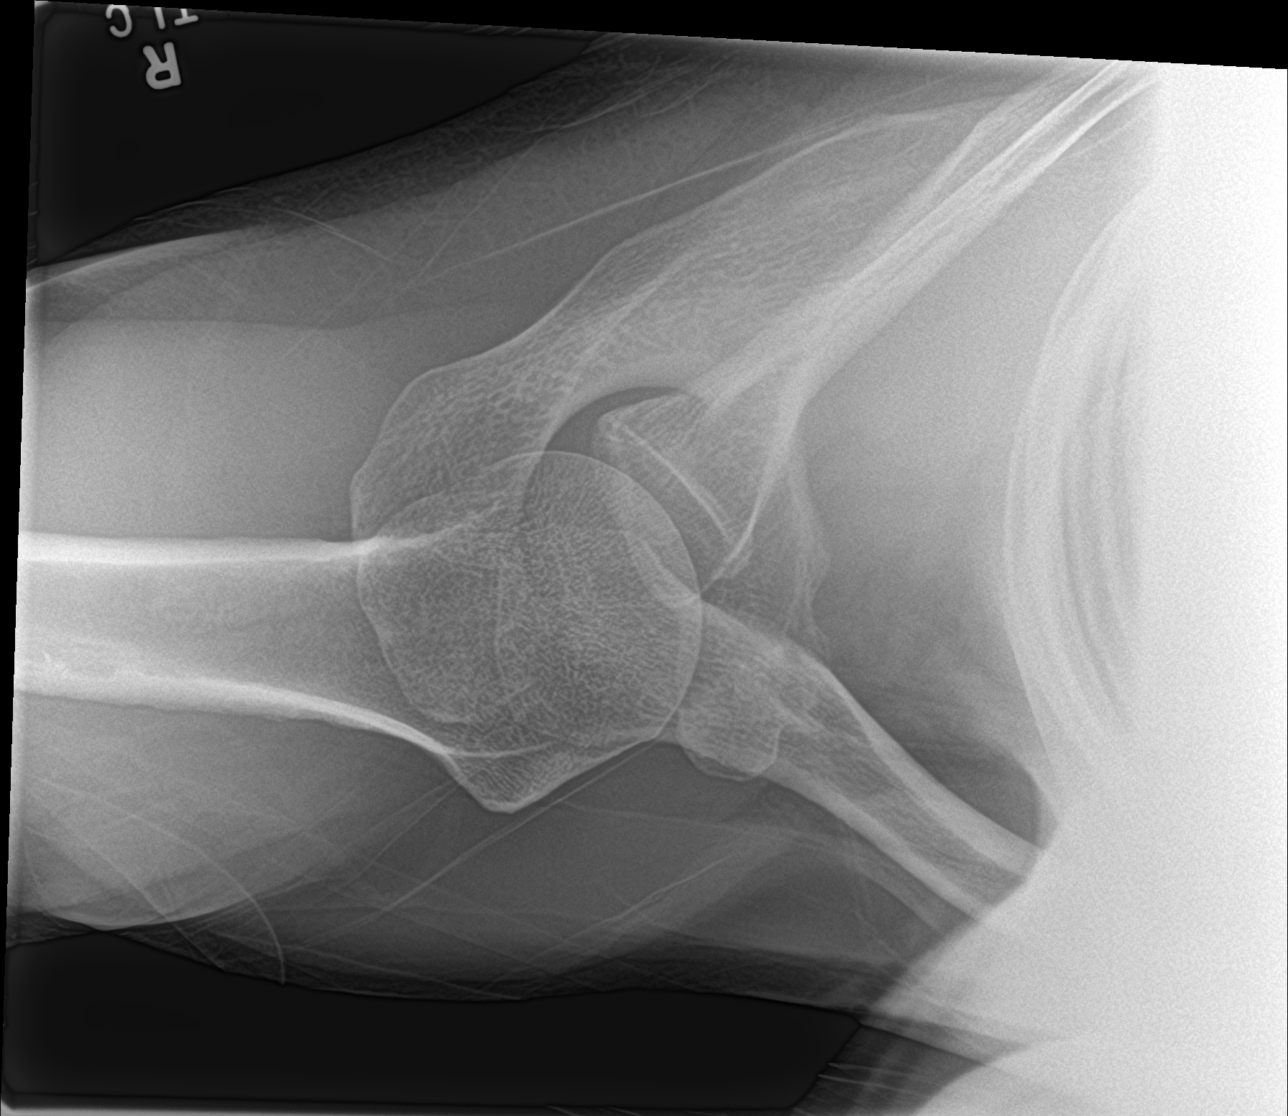

[shoulder grashey]
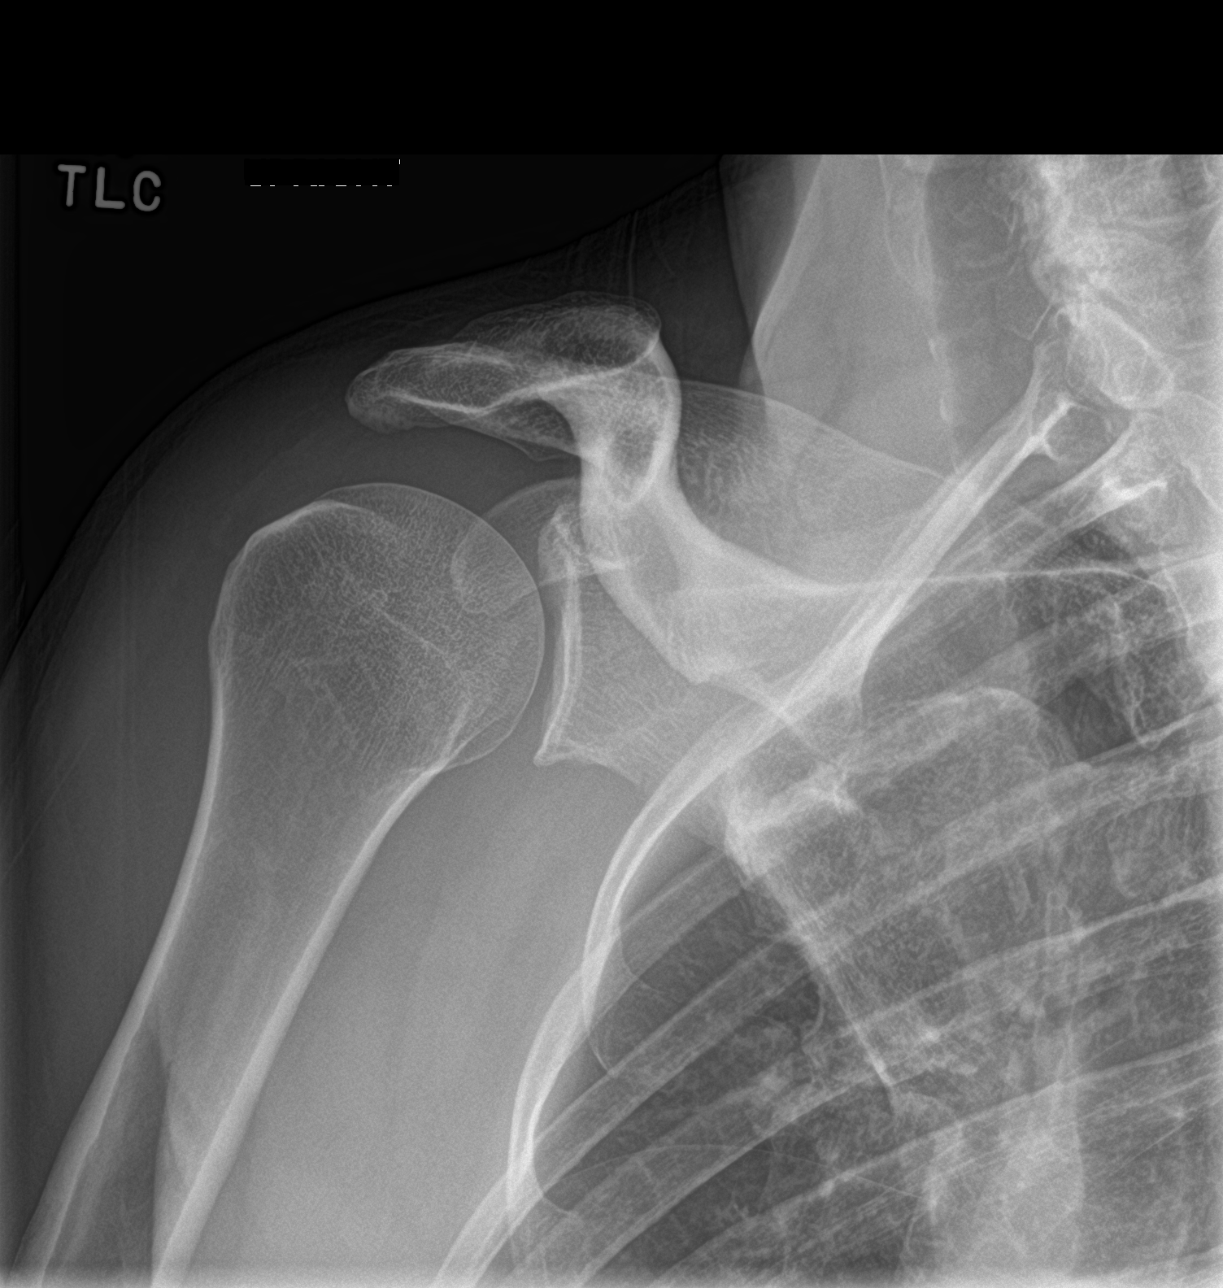

[3 of 3 positions shown; findings below may reference images not displayed]

FINDINGS: Oblique, Y scapular, and axillary images were obtained. No fracture
or dislocation. Joint spaces appear normal. No erosive change or
intra-articular calcification. Visualized right lung clear.
IMPRESSION: No fracture or dislocation.  No evident arthropathy.

## 2021-07-01 IMAGING — DX DG CHEST 1V PORT
1 series · 1 of 1 positions shown · non-contrast
Comparison: 01/18/2019

CLINICAL DATA: Chest pain.

EXAM:
PORTABLE CHEST 1 VIEW

[chest]
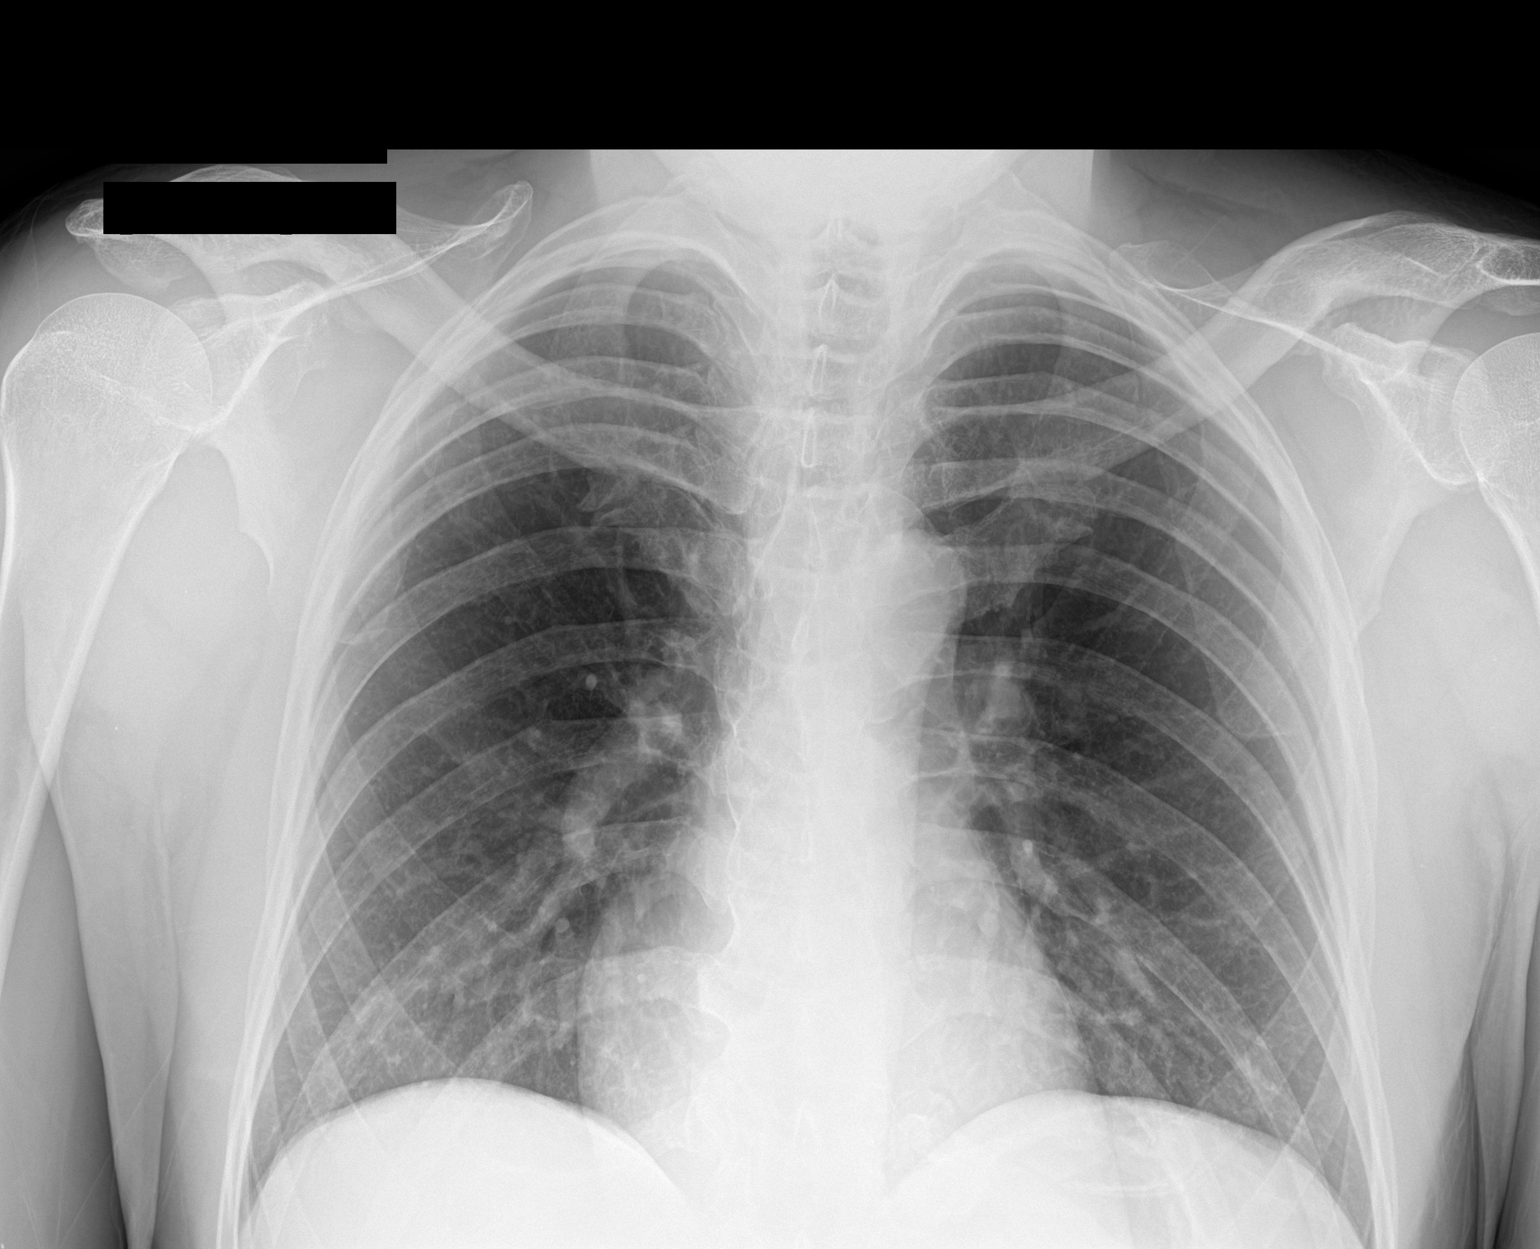

[1 of 1 positions shown; findings below may reference images not displayed]

FINDINGS: The heart size and mediastinal contours are within normal limits.
Both lungs are clear. The visualized skeletal structures are
unremarkable.
IMPRESSION: Normal exam.
